# Patient Record
Sex: Male | Born: 1960
Health system: Southern US, Community
[De-identification: ages and names within clinical notes are randomized; demographics above are authoritative.]

## PROBLEM LIST (undated history)

## (undated) DIAGNOSIS — R52 Pain, unspecified: Secondary | ICD-10-CM

## (undated) DIAGNOSIS — F1020 Alcohol dependence, uncomplicated: Secondary | ICD-10-CM

## (undated) DIAGNOSIS — M1990 Unspecified osteoarthritis, unspecified site: Secondary | ICD-10-CM

## (undated) DIAGNOSIS — C801 Malignant (primary) neoplasm, unspecified: Secondary | ICD-10-CM

## (undated) DIAGNOSIS — F419 Anxiety disorder, unspecified: Secondary | ICD-10-CM

## (undated) DIAGNOSIS — IMO0002 Reserved for concepts with insufficient information to code with codable children: Secondary | ICD-10-CM

## (undated) DIAGNOSIS — F32A Depression, unspecified: Secondary | ICD-10-CM

## (undated) HISTORY — PX: DISTAL BICEPS TENDON REPAIR: SHX1461

---

## 1999-11-05 ENCOUNTER — Encounter: Payer: Self-pay | Admitting: Emergency Medicine

## 1999-11-05 ENCOUNTER — Emergency Department (HOSPITAL_COMMUNITY): Admission: EM | Admit: 1999-11-05 | Discharge: 1999-11-05 | Payer: Self-pay | Admitting: Emergency Medicine

## 2002-08-11 ENCOUNTER — Ambulatory Visit (HOSPITAL_COMMUNITY): Admission: RE | Admit: 2002-08-11 | Discharge: 2002-08-11 | Payer: Self-pay | Admitting: Internal Medicine

## 2010-09-04 ENCOUNTER — Ambulatory Visit (HOSPITAL_COMMUNITY): Admission: RE | Admit: 2010-09-04 | Discharge: 2010-09-04 | Payer: Self-pay | Admitting: Orthopedic Surgery

## 2011-12-29 ENCOUNTER — Encounter: Payer: Self-pay | Admitting: Gastroenterology

## 2012-01-11 ENCOUNTER — Ambulatory Visit (AMBULATORY_SURGERY_CENTER): Payer: 59 | Admitting: *Deleted

## 2012-01-11 VITALS — Ht 71.0 in | Wt 262.0 lb

## 2012-01-11 DIAGNOSIS — Z1211 Encounter for screening for malignant neoplasm of colon: Secondary | ICD-10-CM

## 2012-01-11 MED ORDER — PEG-KCL-NACL-NASULF-NA ASC-C 100 G PO SOLR: ORAL | Status: DC

## 2012-01-25 ENCOUNTER — Ambulatory Visit (AMBULATORY_SURGERY_CENTER): Payer: 59 | Admitting: Gastroenterology

## 2012-01-25 ENCOUNTER — Encounter: Payer: Self-pay | Admitting: Gastroenterology

## 2012-01-25 VITALS — BP 145/73 | HR 66 | Temp 96.5°F | Resp 24 | Ht 71.0 in | Wt 262.0 lb

## 2012-01-25 DIAGNOSIS — K5730 Diverticulosis of large intestine without perforation or abscess without bleeding: Secondary | ICD-10-CM

## 2012-01-25 DIAGNOSIS — Z1211 Encounter for screening for malignant neoplasm of colon: Secondary | ICD-10-CM

## 2012-01-25 MED ORDER — SODIUM CHLORIDE 0.9 % IV SOLN: 500.0000 mL | INTRAVENOUS | Status: DC

## 2012-01-25 NOTE — Patient Instructions (Signed)

## 2012-01-25 NOTE — Op Note (Signed)
[facility] [address] [place], [place]  [address]  COLONOSCOPY PROCEDURE REPORT  PATIENT:  Austin, Hopkins  MR#:  [id number] BIRTHDATE:  28-Nov-1960, 50 yrs. old  GENDER:  male ENDOSCOPIST:  Barbette Hair. Arlyce Dice, MD REF. BY:  Creola Corn, M.D. PROCEDURE DATE:  01/25/2012 PROCEDURE:  Diagnostic Colonoscopy ASA CLASS:  Class I INDICATIONS:  Routine Risk Screening MEDICATIONS:   MAC sedation, administered by CRNA propofol 340mg IV  DESCRIPTION OF PROCEDURE:   After the risks benefits and alternatives of the procedure were thoroughly explained, informed consent was obtained.  Digital rectal exam was performed and revealed no abnormalities.   The LB 180AL [id number] endoscope was introduced through the anus and advanced to the cecum, which was identified by both the appendix and ileocecal valve, without limitations.  The quality of the prep was excellent, using MoviPrep.  The instrument was then slowly withdrawn as the colon was fully examined. <<PROCEDUREIMAGES>>  FINDINGS:  Scattered diverticula were found in the sigmoid colon (see image4).  This was otherwise a normal examination of the colon (see image1 and image5).   Retroflexed views in the rectum revealed no abnormalities.    The time to cecum =  1) 1.75 minutes. The scope was then withdrawn in  1) 9.25  minutes from the cecum and the procedure completed. COMPLICATIONS:  None ENDOSCOPIC IMPRESSION: 1) Diverticula, scattered in the sigmoid colon 2) Otherwise normal examination RECOMMENDATIONS: 1) Continue current colorectal screening recommendations for "routine risk" patients with a repeat colonoscopy in 10 years. REPEAT EXAM:  In 10 year(s) for Colonoscopy.  ______________________________ Barbette Hair. Arlyce Dice, MD  CC:  n. eSIGNED:   Barbette Hair. Seleta Hovland at 01/25/2012 09:29 AM  Lauree Chandler, [phone]

## 2012-01-25 NOTE — Progress Notes (Signed)
Patient did not experience any of the following events: a burn prior to discharge; a fall within the facility; wrong site/side/patient/procedure/implant event; or a hospital transfer or hospital admission upon discharge from the facility. (G8907) Patient did not have preoperative order for IV antibiotic SSI prophylaxis. (G8918)  

## 2012-01-26 ENCOUNTER — Telehealth: Payer: Self-pay | Admitting: *Deleted

## 2012-01-26 NOTE — Telephone Encounter (Signed)
No answer. Message left. 

## 2012-07-17 ENCOUNTER — Encounter (HOSPITAL_COMMUNITY): Payer: Self-pay | Admitting: Emergency Medicine

## 2012-07-17 ENCOUNTER — Emergency Department (HOSPITAL_COMMUNITY)
Admission: EM | Admit: 2012-07-17 | Discharge: 2012-07-18 | Disposition: A | Discharge status: Home / Self Care | Payer: 59 | Attending: Emergency Medicine | Admitting: Emergency Medicine

## 2012-07-17 DIAGNOSIS — F17200 Nicotine dependence, unspecified, uncomplicated: Secondary | ICD-10-CM | POA: Insufficient documentation

## 2012-07-17 DIAGNOSIS — Z79899 Other long term (current) drug therapy: Secondary | ICD-10-CM | POA: Insufficient documentation

## 2012-07-17 DIAGNOSIS — F411 Generalized anxiety disorder: Secondary | ICD-10-CM | POA: Insufficient documentation

## 2012-07-17 DIAGNOSIS — F419 Anxiety disorder, unspecified: Secondary | ICD-10-CM

## 2012-07-17 LAB — CBC WITH DIFFERENTIAL/PLATELET
Hemoglobin: 15.4 g/dL (ref 13.0–17.0)
Lymphocytes Relative: 19 % (ref 12–46)
Lymphs Abs: 1.3 10*3/uL (ref 0.7–4.0)
Monocytes Relative: 6 % (ref 3–12)
Neutro Abs: 4.9 10*3/uL (ref 1.7–7.7)
Neutrophils Relative %: 74 % (ref 43–77)
Platelets: 188 10*3/uL (ref 150–400)
RBC: 4.47 MIL/uL (ref 4.22–5.81)
WBC: 6.7 10*3/uL (ref 4.0–10.5)

## 2012-07-17 LAB — RAPID URINE DRUG SCREEN, HOSP PERFORMED
Amphetamines: NOT DETECTED
Barbiturates: NOT DETECTED
Benzodiazepines: NOT DETECTED
Cocaine: NOT DETECTED
Tetrahydrocannabinol: POSITIVE — AB

## 2012-07-17 MED ORDER — ALUM & MAG HYDROXIDE-SIMETH 200-200-20 MG/5ML PO SUSP: 30.0000 mL | ORAL | Status: DC | PRN

## 2012-07-17 MED ORDER — NICOTINE 21 MG/24HR TD PT24: 21.0000 mg | MEDICATED_PATCH | Freq: Every day | TRANSDERMAL | Status: DC

## 2012-07-17 MED ORDER — ZOLPIDEM TARTRATE 5 MG PO TABS: 5.0000 mg | ORAL_TABLET | Freq: Every evening | ORAL | Status: DC | PRN

## 2012-07-17 MED ORDER — ACETAMINOPHEN 325 MG PO TABS
650.0000 mg | ORAL_TABLET | ORAL | Status: DC | PRN
  Administered 2012-07-17: 650 mg via ORAL
  Filled 2012-07-17: qty 2

## 2012-07-17 MED ORDER — IBUPROFEN 200 MG PO TABS: 600.0000 mg | ORAL_TABLET | Freq: Three times a day (TID) | ORAL | Status: DC | PRN

## 2012-07-17 MED ORDER — ONDANSETRON HCL 8 MG PO TABS: 4.0000 mg | ORAL_TABLET | Freq: Three times a day (TID) | ORAL | Status: DC | PRN

## 2012-07-17 NOTE — ED Provider Notes (Signed)
History     CSN: [id number]  Arrival date & time 07/17/12  2122   First MD Initiated Contact with Patient 07/17/12 2149      Chief Complaint  Patient presents with  . Suicidal    (Consider location/radiation/quality/duration/timing/severity/associated sxs/prior treatment) HPI  Patient presents to the emergency department [facility] [employer] with IVC papers. The patient's fianc took the papers out on him accusing him of being suicidal. Patient is a Publishing rights manager and works for [employer] and states that he and his fianc got into a fight yesterday a very basic verbal altercation after she got caught for stealing at the mall within the past couple of days. He states that since then she threatens to take out papers on him but then told him that she changed her mind. She's not yet come home since the incident because he says that she is ashamed of herself. The patient denies being suicidal or homicidal. He denies threatening her that he was suicidal during the argument. The patient is in no acute distress but he is very upset about being forced to be in the ER this time. Patient has no psych history aside from anxiety.  History reviewed. No pertinent past medical history.  Past Surgical History  Procedure Date  . Distal biceps tendon repair 4  years ago    right-Dr.Groning    Family History  Problem Relation Age of Onset  . Colon cancer Neg Hx     History  Substance Use Topics  . Smoking status: Current Everyday Smoker    Types: Cigars, Cigarettes    Last Attempt to Quit: 11/10/1991  . Smokeless tobacco: Never Used   Comment: rare cigar  . Alcohol Use: 8.4 oz/week    14 Shots of liquor per week     2 drinks of Whiskey per day per patient.      Review of Systems  Review of Systems  Gen: no weight loss, fevers, chills, night sweats  Eyes: no discharge or drainage, no occular pain or visual changes  Nose: no epistaxis or rhinorrhea  Mouth: no dental pain, no sore throat    Neck: no neck pain  Lungs:No wheezing, coughing or hemoptysis CV: no chest pain, palpitations, dependent edema or orthopnea  Abd: no abdominal pain, nausea, vomiting  GU: no dysuria or gross hematuria  MSK:  No abnormalities  Neuro: no headache, no focal neurologic deficits  Skin: no abnormalities Psyche: negative.   Allergies  Review of patient's allergies indicates no known allergies.  Home Medications   Current Outpatient Rx  Name Route Sig Dispense Refill  . ALPRAZOLAM 0.5 MG PO TABS Oral Take 1 tablet by mouth as needed. For anxiety    . ASPIRIN 325 MG PO TABS Oral Take 325 mg by mouth daily.    Marland Kitchen ESZOPICLONE 2 MG PO TABS Oral Take 2 mg by mouth at bedtime as needed. Take immediately before bedtime    . HYDROCODONE-IBUPROFEN 7.5-200 MG PO TABS Oral Take 0.5 tablets by mouth daily.    . ADULT MULTIVITAMIN W/MINERALS CH Oral Take 1 tablet by mouth daily.      BP 148/90  Pulse 95  Temp 98.8 F (37.1 C) (Oral)  Resp 18  SpO2 95%  Physical Exam  Nursing note and vitals reviewed. Constitutional: He appears well-developed and well-nourished. No distress.  HENT:  Head: Normocephalic and atraumatic.  Eyes: Pupils are equal, round, and reactive to light.  Neck: Normal range of motion. Neck supple.  Cardiovascular: Normal rate  and regular rhythm.   Pulmonary/Chest: Effort normal.  Abdominal: Soft.  Neurological: He is alert.  Skin: Skin is warm and dry.  Psychiatric: His mood appears anxious. His affect is angry. He does not exhibit a depressed mood. He expresses no homicidal and no suicidal ideation. He expresses no suicidal plans and no homicidal plans.    ED Course  Procedures (including critical care time)  Labs Reviewed  CBC WITH DIFFERENTIAL - Abnormal; Notable for the following:    MCH 34.5 (*)     All other components within normal limits  BASIC METABOLIC PANEL - Abnormal; Notable for the following:    Glucose, Bld 121 (*)     GFR calc non Af Amer 84 (*)      All other components within normal limits  URINE RAPID DRUG SCREEN (HOSP PERFORMED) - Abnormal; Notable for the following:    Opiates POSITIVE (*)     Tetrahydrocannabinol POSITIVE (*)     All other components within normal limits  ETHANOL - Abnormal; Notable for the following:    Alcohol, Ethyl (B) 169 (*)     All other components within normal limits   No results found.   1. Anxiety       MDM  Patient denies being suicidal. I have consulted with the act team who will decide whether the patient needs a pillow psych or not. I do not feel that the patient is suicidal and is my recommendation that he has IVC papers overturned however I have ordered tells psych.  Opiates, alcohol and weed found in patients system Telepsych ordered. Pt medically cleared.     Dorthula Matas, PA 07/18/12 [phone]

## 2012-07-17 NOTE — ED Notes (Signed)
Pt placed in blue scrubs, and wanded by security

## 2012-07-17 NOTE — BH Assessment (Signed)
Assessment Note   Austin Hopkins is an 51 y.o. male. Pt was placed under IVC petition taken out by his fiancee today.  ACT was unable to reach fiancee, [employer], by phone.  Pt reports that he and his fiancee are to be married this coming Friday.  This past Friday, fiancee was arrested for shoplifting, which led to a significant argument between them.  Pt reports he has been trying to talk with fiancee since this happened but they have only spoken briefly and she is mostly not returning his calls.  Today she stopped by briefly, pt was home all day today, and the sheriff picked him up this evening informing him of the IVC.  Pt denies SI/HI/AV.  States he has no psych history or substance abuse treatment history of any kind.  PT does admit to daily alcohol use for past 10+ years, 2-5 drinks per day.  Pt denies any problems associated with his alcohol use.  ACT was able to speak to pt's daughter, Juvencio Verdi, who was at [facility] with pt.  She reports that her father has a problem with drinking and getting very angry, which has gone on for some time.  She reports that a friend of her father's stopped by to check on him today and found him with his gun out.  The friend was upset by the encounter and left.  Pt reports that she does not think her father would harm himself however she is a little unnerved by the incident with the gun today and with his alcohol use.   Axis I: deferred Axis II: Deferred Axis III: History reviewed. No pertinent past medical history. Axis IV: problems with primary support group Axis V: 61-70 mild symptoms  Past Medical History: History reviewed. No pertinent past medical history.  Past Surgical History  Procedure Date  . Distal biceps tendon repair 4  years ago    right-Dr.Groning    Family History:  Family History  Problem Relation Age of Onset  . Colon cancer Neg Hx     Social History:  reports that he has been smoking Cigars and Cigarettes.  He has never used  smokeless tobacco. He reports that he drinks about 8.4 ounces of alcohol per week. He reports that he does not use illicit drugs.  Additional Social History:  Alcohol / Drug Use Pain Medications: Pt denies Prescriptions: Pt denies Over the Counter: Pt denies History of alcohol / drug use?: Yes Longest period of sobriety (when/how long): none Substance #1 Name of Substance 1: alcohol 1 - Age of First Use: 17 1 - Amount (size/oz): 2-5 drinks 1 - Frequency: daily 1 - Duration: 10-15 years 1 - Last Use / Amount: 07/18/12, 4 drinks  CIWA: CIWA-Ar BP: 148/90 mmHg Pulse Rate: 95  Nausea and Vomiting: no nausea and no vomiting Tactile Disturbances: none Tremor: no tremor Auditory Disturbances: not present Paroxysmal Sweats: no sweat visible Visual Disturbances: not present Anxiety: no anxiety, at ease Headache, Fullness in Head: none present Agitation: normal activity Orientation and Clouding of Sensorium: oriented and can do serial additions CIWA-Ar Total: 0  COWS:    Allergies: No Known Allergies  Home Medications:  (Not in a hospital admission)  OB/GYN Status:  No LMP for male patient.  General Assessment Data Location of Assessment: [facility] ED ACT Assessment: Yes Living Arrangements: Spouse/significant other Can pt return to current living arrangement?: Yes Admission Status: Involuntary     Risk to self Suicidal Ideation: No Suicidal Intent: No Is  patient at risk for suicide?: No Suicidal Plan?: No Access to Means: No What has been your use of drugs/alcohol within the last 12 months?: daily use of alcohol Previous Attempts/Gestures: No Intentional Self Injurious Behavior: None Family Suicide History: No Recent stressful life event(s):  (pt denies ) Persecutory voices/beliefs?: No Depression: No Substance abuse history and/or treatment for substance abuse?: No Suicide prevention information given to non-admitted patients: Yes  Risk to Others Homicidal Ideation:  No Thoughts of Harm to Others: No Current Homicidal Intent: No Current Homicidal Plan: No Access to Homicidal Means: No History of harm to others?: No Assessment of Violence: None Noted Does patient have access to weapons?: Yes (Comment) (3 guns in home) Criminal Charges Pending?: No Does patient have a court date: No  Psychosis Hallucinations: None noted Delusions: None noted  Mental Status Report Appear/Hygiene: Other (Comment) (casual) Eye Contact: Good Motor Activity: Unremarkable Speech: Logical/coherent Level of Consciousness: Alert Mood: Other (Comment) (cooperative) Affect: Appropriate to circumstance Anxiety Level: Minimal Thought Processes: Coherent;Relevant Judgement: Unimpaired Orientation: Person;Place;Time;Situation Obsessive Compulsive Thoughts/Behaviors: None  Cognitive Functioning Concentration: Normal Memory: Recent Intact;Remote Intact IQ: Above Average Insight: Good Impulse Control: Fair Appetite: Good Weight Loss: 20  (intentional) Weight Gain: 0  Sleep: No Change Vegetative Symptoms: None  ADLScreening [facility] Assessment Services) Patient's cognitive ability adequate to safely complete daily activities?: Yes Patient able to express need for assistance with ADLs?: Yes Independently performs ADLs?: Yes (appropriate for developmental age)  Abuse/Neglect [facility]) Physical Abuse: Denies Verbal Abuse: Denies Sexual Abuse: Denies  Prior Inpatient Therapy Prior Inpatient Therapy: No  Prior Outpatient Therapy Prior Outpatient Therapy: No  ADL Screening (condition at time of admission) Patient's cognitive ability adequate to safely complete daily activities?: Yes Patient able to express need for assistance with ADLs?: Yes Independently performs ADLs?: Yes (appropriate for developmental age) Weakness of Legs: None Weakness of Arms/Hands: None  Home Assistive Devices/Equipment Home Assistive Devices/Equipment: None    Abuse/Neglect Assessment  (Assessment to be complete while patient is alone) Physical Abuse: Denies Verbal Abuse: Denies Sexual Abuse: Denies Exploitation of patient/patient's resources: Denies Self-Neglect: Denies     Merchant navy officer (For Healthcare) Advance Directive: Patient does not have advance directive;Patient would not like information    Additional Information 1:1 In Past 12 Months?: No CIRT Risk: No Elopement Risk: No Does patient have medical clearance?: Yes     Disposition: I discussed this pt with PA Neva Seat and with Dr Radford Pax of [facility].  Decision made to have telepsych evaluate the pt before making determination to release him from the IVC petition.    On Site Evaluation by:   Reviewed with Physician:     Lorri Frederick 07/17/2012 10:41 PM

## 2012-07-17 NOTE — ED Notes (Addendum)
Fiance took out IVC paperwork on pt.  Pt here with [facility] Dept and denies any symptoms and denies suicidal ideation. Pt states he did have argument with fiance tonight.

## 2012-07-18 LAB — BASIC METABOLIC PANEL
BUN: 9 mg/dL (ref 6–23)
CO2: 25 mEq/L (ref 19–32)
Chloride: 102 mEq/L (ref 96–112)
GFR calc non Af Amer: 84 mL/min — ABNORMAL LOW (ref >90)
Glucose, Bld: 121 mg/dL — ABNORMAL HIGH (ref 70–99)
Potassium: 3.7 mEq/L (ref 3.5–5.1)
Sodium: 141 mEq/L (ref 135–145)

## 2012-07-18 NOTE — ED Provider Notes (Addendum)
Medical screening examination/treatment/procedure(s) were performed by non-physician practitioner and as supervising physician I was immediately available for consultation/collaboration.   Sunnie Nielsen, MD 07/18/12 0124  1:25 AM DR Jacky Kindle, PSY recs d/c home, is NOT suicidal.  Sunnie Nielsen, MD 07/18/12 [phone]

## 2012-07-18 NOTE — ED Notes (Addendum)
telepsych interview occurring now

## 2012-07-18 NOTE — ED Notes (Signed)
Telepsych complete. IVC officially rescinded. Placed on chart. Orders to d/c and d/c instructions received per Dr. Dierdre Highman. Belongings returned to pt, signed for by pt (witnessed by BB, CN) adult daughter with pt (ride home).

## 2012-07-18 NOTE — ED Notes (Signed)
Confirmed 3rd on list for telepsych.

## 2012-07-18 NOTE — ED Notes (Addendum)
Pt amiable, alert, NAD, calm, interactive, intermitantly tearful, mentions mild HA. Talking thru events of last 24 hrs. Sitter at [facility], belongings bagged labeled and in locker 1. Given soda per request. Denies SI/ HI.

## 2014-07-27 ENCOUNTER — Other Ambulatory Visit: Payer: Self-pay | Admitting: Urology

## 2014-08-01 ENCOUNTER — Other Ambulatory Visit: Payer: Self-pay | Admitting: Urology

## 2014-08-01 MED ORDER — INDOCYANINE GREEN 25 MG IV SOLR: 2.5000 mg | Freq: Once | INTRAVENOUS | Status: AC

## 2014-09-03 ENCOUNTER — Other Ambulatory Visit: Payer: Self-pay | Admitting: Urology

## 2014-10-10 ENCOUNTER — Encounter (HOSPITAL_COMMUNITY): Admission: RE | Payer: Self-pay | Source: Ambulatory Visit

## 2014-10-10 ENCOUNTER — Inpatient Hospital Stay (HOSPITAL_COMMUNITY): Admission: RE | Admit: 2014-10-10 | Payer: 59 | Source: Ambulatory Visit | Admitting: Urology

## 2014-10-10 SURGERY — ROBOTIC ASSISTED LAPAROSCOPIC RADICAL PROSTATECTOMY
Anesthesia: General

## 2014-10-12 ENCOUNTER — Inpatient Hospital Stay (HOSPITAL_COMMUNITY): Admission: RE | Admit: 2014-10-12 | Payer: 59 | Source: Ambulatory Visit

## 2014-10-12 ENCOUNTER — Encounter (HOSPITAL_COMMUNITY)
Admission: RE | Admit: 2014-10-12 | Discharge: 2014-10-12 | Disposition: A | Discharge status: Home / Self Care | Payer: 59 | Source: Ambulatory Visit | Attending: Urology | Admitting: Urology

## 2014-10-12 ENCOUNTER — Encounter (HOSPITAL_COMMUNITY): Payer: Self-pay

## 2014-10-12 ENCOUNTER — Other Ambulatory Visit: Payer: Self-pay

## 2014-10-12 ENCOUNTER — Ambulatory Visit (HOSPITAL_COMMUNITY)
Admission: RE | Admit: 2014-10-12 | Discharge: 2014-10-12 | Disposition: A | Discharge status: Home / Self Care | Payer: 59 | Source: Ambulatory Visit | Attending: Urology | Admitting: Urology

## 2014-10-12 DIAGNOSIS — R001 Bradycardia, unspecified: Secondary | ICD-10-CM | POA: Insufficient documentation

## 2014-10-12 DIAGNOSIS — F17210 Nicotine dependence, cigarettes, uncomplicated: Secondary | ICD-10-CM | POA: Diagnosis not present

## 2014-10-12 DIAGNOSIS — C61 Malignant neoplasm of prostate: Secondary | ICD-10-CM

## 2014-10-12 DIAGNOSIS — Z01818 Encounter for other preprocedural examination: Secondary | ICD-10-CM | POA: Diagnosis present

## 2014-10-12 HISTORY — DX: Anxiety disorder, unspecified: F41.9

## 2014-10-12 HISTORY — DX: Malignant (primary) neoplasm, unspecified: C80.1

## 2014-10-12 HISTORY — DX: Pain, unspecified: R52

## 2014-10-12 HISTORY — DX: Reserved for concepts with insufficient information to code with codable children: IMO0002

## 2014-10-12 LAB — BASIC METABOLIC PANEL
Anion gap: 12 (ref 5–15)
BUN: 13 mg/dL (ref 6–23)
CALCIUM: 9.2 mg/dL (ref 8.4–10.5)
CO2: 25 mEq/L (ref 19–32)
Chloride: 100 mEq/L (ref 96–112)
Creatinine, Ser: 0.93 mg/dL (ref 0.50–1.35)
GFR calc Af Amer: >90 mL/min (ref >90)
Glucose, Bld: 101 mg/dL — ABNORMAL HIGH (ref 70–99)
Potassium: 4.4 mEq/L (ref 3.7–5.3)
Sodium: 137 mEq/L (ref 137–147)

## 2014-10-12 LAB — CBC
HCT: 42.5 % (ref 39.0–52.0)
Hemoglobin: 14.4 g/dL (ref 13.0–17.0)
MCH: 34.5 pg — ABNORMAL HIGH (ref 26.0–34.0)
MCHC: 33.9 g/dL (ref 30.0–36.0)
MCV: 101.9 fL — ABNORMAL HIGH (ref 78.0–100.0)
Platelets: 180 10*3/uL (ref 150–400)
RBC: 4.17 MIL/uL — ABNORMAL LOW (ref 4.22–5.81)
RDW: 13.1 % (ref 11.5–15.5)
WBC: 5.1 10*3/uL (ref 4.0–10.5)

## 2014-10-12 LAB — ABO/RH: ABO/RH(D): O POS

## 2014-10-12 NOTE — Patient Instructions (Signed)
YOUR SURGERY IS SCHEDULED AT [facility]  [id number]  12/7  REPORT TO  SHORT STAY CENTER AT:  9:00 AM   DO NOT EAT OR DRINK ANYTHING AFTER MIDNIGHT THE NIGHT BEFORE YOUR SURGERY.  YOU MAY BRUSH YOUR TEETH, RINSE OUT YOUR MOUTH--BUT NO WATER, NO FOOD, NO CHEWING GUM, NO MINTS, NO CANDIES, NO CHEWING TOBACCO.  PLEASE TAKE THE FOLLOWING MEDICATIONS THE AM OF YOUR SURGERY WITH A FEW SIPS OF WATER:   HYDROCODONE / IBUPROFEN IF NEEDED    DO NOT BRING VALUABLES, MONEY, CREDIT CARDS.  DO NOT WEAR JEWELRY, MAKE-UP, NAIL POLISH AND NO METAL PINS OR CLIPS IN YOUR HAIR. CONTACT LENS, DENTURES / PARTIALS, GLASSES SHOULD NOT BE WORN TO SURGERY AND IN MOST CASES-HEARING AIDS WILL NEED TO BE REMOVED.  BRING YOUR GLASSES CASE, ANY EQUIPMENT NEEDED FOR YOUR CONTACT LENS. FOR PATIENTS ADMITTED TO THE HOSPITAL--CHECK OUT TIME THE DAY OF DISCHARGE IS 11:00 AM.  ALL INPATIENT ROOMS ARE PRIVATE - WITH BATHROOM, TELEPHONE, TELEVISION AND WIFI INTERNET.    PLEASE BE AWARE THAT YOU MAY NEED ADDITIONAL BLOOD DRAWN DAY OF YOUR SURGERY  _______________________________________________________________________   [facility] - Preparing for Surgery Before surgery, you can play an important role.  Because skin is not sterile, your skin needs to be as free of germs as possible.  You can reduce the number of germs on your skin by washing with CHG (chlorahexidine gluconate) soap before surgery.  CHG is an antiseptic cleaner which kills germs and bonds with the skin to continue killing germs even after washing. Please DO NOT use if you have an allergy to CHG or antibacterial soaps.  If your skin becomes reddened/irritated stop using the CHG and inform your nurse when you arrive at Short Stay. Do not shave (including legs and underarms) for at least 48 hours prior to the first CHG shower.  You may shave your face/neck. Please follow these instructions carefully:  1.  Shower with CHG Soap the night before  surgery and the  morning of Surgery.  2.  If you choose to wash your hair, wash your hair first as usual with your  normal  shampoo.  3.  After you shampoo, rinse your hair and body thoroughly to remove the  shampoo.                           4.  Use CHG as you would any other liquid soap.  You can apply chg directly  to the skin and wash                       Gently with a scrungie or clean washcloth.  5.  Apply the CHG Soap to your body ONLY FROM THE NECK DOWN.   Do not use on face/ open                           Wound or open sores. Avoid contact with eyes, ears mouth and genitals (private parts).                       Wash face,  Genitals (private parts) with your normal soap.             6.  Wash thoroughly, paying special attention to the area where your surgery  will be performed.  7.  Thoroughly rinse your body with  warm water from the neck down.  8.  DO NOT shower/wash with your normal soap after using and rinsing off  the CHG Soap.                9.  Pat yourself dry with a clean towel.            10.  Wear clean pajamas.            11.  Place clean sheets on your bed the night of your first shower and do not  sleep with pets. Day of Surgery : Do not apply any lotions/deodorants the morning of surgery.  Please wear clean clothes to the hospital/surgery center.  FAILURE TO FOLLOW THESE INSTRUCTIONS MAY RESULT IN THE CANCELLATION OF YOUR SURGERY PATIENT SIGNATURE_________________________________  NURSE SIGNATURE__________________________________  ________________________________________________________________________   Austin Hopkins  An incentive spirometer is a tool that can help keep your lungs clear and active. This tool measures how well you are filling your lungs with each breath. Taking long deep breaths may help reverse or decrease the chance of developing breathing (pulmonary) problems (especially infection) following:  A long period of time when you are unable to  move or be active. BEFORE THE PROCEDURE   If the spirometer includes an indicator to show your best effort, your nurse or respiratory therapist will set it to a desired goal.  If possible, sit up straight or lean slightly forward. Try not to slouch.  Hold the incentive spirometer in an upright position. INSTRUCTIONS FOR USE   Sit on the edge of your bed if possible, or sit up as far as you can in bed or on a chair.  Hold the incentive spirometer in an upright position.  Breathe out normally.  Place the mouthpiece in your mouth and seal your lips tightly around it.  Breathe in slowly and as deeply as possible, raising the piston or the ball toward the top of the column.  Hold your breath for 3-5 seconds or for as long as possible. Allow the piston or ball to fall to the bottom of the column.  Remove the mouthpiece from your mouth and breathe out normally.  Rest for a few seconds and repeat Steps 1 through 7 at least 10 times every 1-2 hours when you are awake. Take your time and take a few normal breaths between deep breaths.  The spirometer may include an indicator to show your best effort. Use the indicator as a goal to work toward during each repetition.  After each set of 10 deep breaths, practice coughing to be sure your lungs are clear. If you have an incision (the cut made at the time of surgery), support your incision when coughing by placing a pillow or rolled up towels firmly against it. Once you are able to get out of bed, walk around indoors and cough well. You may stop using the incentive spirometer when instructed by your caregiver.  RISKS AND COMPLICATIONS  Take your time so you do not get dizzy or light-headed.  If you are in pain, you may need to take or ask for pain medication before doing incentive spirometry. It is harder to take a deep breath if you are having pain. AFTER USE  Rest and breathe slowly and easily.  It can be helpful to keep track of a log of  your progress. Your caregiver can provide you with a simple table to help with this. If you are using the spirometer at home, follow these  instructions: SEEK MEDICAL CARE IF:   You are having difficultly using the spirometer.  You have trouble using the spirometer as often as instructed.  Your pain medication is not giving enough relief while using the spirometer.  You develop fever of 100.5 F (38.1 C) or higher. SEEK IMMEDIATE MEDICAL CARE IF:   You cough up bloody sputum that had not been present before.  You develop fever of 102 F (38.9 C) or greater.  You develop worsening pain at or near the incision site. MAKE SURE YOU:   Understand these instructions.  Will watch your condition.  Will get help right away if you are not doing well or get worse. Document Released: 03/08/2007 Document Revised: 01/18/2012 Document Reviewed: 05/09/2007 ExitCare Patient Information 2014 Exit[employer], [place].   ________________________________________________________________________  WHAT IS A BLOOD TRANSFUSION? Blood Transfusion Information  A transfusion is the replacement of blood or some of its parts. Blood is made up of multiple cells which provide different functions.  Red blood cells carry oxygen and are used for blood loss replacement.  White blood cells fight against infection.  Platelets control bleeding.  Plasma helps clot blood.  Other blood products are available for specialized needs, such as hemophilia or other clotting disorders. BEFORE THE TRANSFUSION  Who gives blood for transfusions?   Healthy volunteers who are fully evaluated to make sure their blood is safe. This is blood bank blood. Transfusion therapy is the safest it has ever been in the practice of medicine. Before blood is taken from a donor, a complete history is taken to make sure that person has no history of diseases nor engages in risky social behavior (examples are intravenous drug use or sexual activity  with multiple partners). The donor's travel history is screened to minimize risk of transmitting infections, such as malaria. The donated blood is tested for signs of infectious diseases, such as HIV and hepatitis. The blood is then tested to be sure it is compatible with you in order to minimize the chance of a transfusion reaction. If you or a relative donates blood, this is often done in anticipation of surgery and is not appropriate for emergency situations. It takes many days to process the donated blood. RISKS AND COMPLICATIONS Although transfusion therapy is very safe and saves many lives, the main dangers of transfusion include:   Getting an infectious disease.  Developing a transfusion reaction. This is an allergic reaction to something in the blood you were given. Every precaution is taken to prevent this. The decision to have a blood transfusion has been considered carefully by your caregiver before blood is given. Blood is not given unless the benefits outweigh the risks. AFTER THE TRANSFUSION  Right after receiving a blood transfusion, you will usually feel much better and more energetic. This is especially true if your red blood cells have gotten low (anemic). The transfusion raises the level of the red blood cells which carry oxygen, and this usually causes an energy increase.  The nurse administering the transfusion will monitor you carefully for complications. HOME CARE INSTRUCTIONS  No special instructions are needed after a transfusion. You may find your energy is better. Speak with your caregiver about any limitations on activity for underlying diseases you may have. SEEK MEDICAL CARE IF:   Your condition is not improving after your transfusion.  You develop redness or irritation at the intravenous (IV) site. SEEK IMMEDIATE MEDICAL CARE IF:  Any of the following symptoms occur over the next 12 hours:  Shaking  chills.  You have a temperature by mouth above 102 F (38.9  C), not controlled by medicine.  Chest, back, or muscle pain.  People around you feel you are not acting correctly or are confused.  Shortness of breath or difficulty breathing.  Dizziness and fainting.  You get a rash or develop hives.  You have a decrease in urine output.  Your urine turns a dark color or changes to pink, red, or brown. Any of the following symptoms occur over the next 10 days:  You have a temperature by mouth above 102 F (38.9 C), not controlled by medicine.  Shortness of breath.  Weakness after normal activity.  The white part of the eye turns yellow (jaundice).  You have a decrease in the amount of urine or are urinating less often.  Your urine turns a dark color or changes to pink, red, or brown. Document Released: 10/23/2000 Document Revised: 01/18/2012 Document Reviewed: 06/11/2008 [facility] Patient Information 2014 [place], [place].  _______________________________________________________________________

## 2014-10-12 NOTE — Pre-Procedure Instructions (Signed)
EKG AND CXR WERE DONE TODAY PREOP AT [facility] AS PER ORDERS DR. BORDEN PT'S CHART GIVEN TO FOLLOW UP NURSE WILHEMINA HENDRIX, RN FOR REVIEW OF ALL PREOP TEST RESULTS.

## 2014-10-13 NOTE — H&P (Signed)
Chief Complaint Prostate Cancer   Reason For Visit Reason for visit: 2nd opinion regarding treatment options for prostate cancer.  PCP: Dr. 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 He presented to Dr. Franchot Gallo with an elevated PSA. This remained elevated at 4.71 when rechecked prompting a prostate biopsy on 07/05/14. His biopsy demonstrated Gleason 3+4=7 adenocarcinoma of the prostate with 4 out of 12 biopsy cores positive for malignancy. He has no family history of prostate cancer. He is exceedingly healthy overall. He has been very well-informed through prior discussions with Dr. Diona Fanti and Dr. Tresa Moore. He presents today for second opinion and potentially proceed with surgical treatment.    TNM stage: cT1c Nx Mx  PSA: 4.71  Gleason score: 3+4=7  Biopsy (07/05/14): 4/12 cores positive   Left: L mid (10%, 3+3=6)   Right: R lateral apex (30%, 3+4=7), R mid (30%, 3+4=7), R lateral mid (30%, 3+3=6)  Prostate volume: 21.5 cc    Nomogram  OC disease: 46%  EPE: 53%  SVI: 3%  LNI: 2%  PFS (surgery): 90% at 5 years, 82% at 10 years    Urinary function: He has minimal lower urinary tract symptoms. IPSS is 3.  Erectile function: He has mild erectile dysfunction. SHIM score is 20. He has previously responded well to Levitra.   Past Medical History Problems  1. History of No significant past medical history  Surgical History Problems  1. History of Arm Incision  Current Meds 1. Vicoprofen TABS; 5/325 for shoulder/arm pain;  Therapy: (Recorded:26Jun2015) to Recorded  Allergies Medication  1. No Known Drug Allergies  Family History Problems  1. Family history of Death of family member : Mother, Father   mother deceased at age 16; multiple med problemsfather deceasd at age 40; [place]     cancer 2. Family history of lung cancer (Z80.1) : Father  Social History Problems     Alcohol use (F10.99)   2-4   Caffeine use (F15.90)   1-2   Former smoker [phone])   smoked for approx 10 yrs & quit in 2000   Married   Occupation   NP w/[employer]  Review of Systems Constitutional, skin, eye, otolaryngeal, hematologic/lymphatic, cardiovascular, pulmonary, endocrine, musculoskeletal, gastrointestinal, neurological and psychiatric system(s) were reviewed and pertinent findings if present are noted.    Vitals Vital Signs [Data Includes: Last 1 Day]  Recorded: [id number] 08:24AM  Blood Pressure: 183 / 91 Heart Rate: 65  Physical Exam Constitutional: Well nourished and well developed . No acute distress.  ENT:. The ears and nose are normal in appearance.  Neck: The appearance of the neck is normal and no neck mass is present.  Pulmonary: No respiratory distress, normal respiratory rhythm and effort and clear bilateral breath sounds.  Cardiovascular: Heart rate and rhythm are normal . No peripheral edema.  Abdomen: The abdomen is soft and nontender. No masses are palpated. No CVA tenderness. No hernias are palpable. No hepatosplenomegaly noted.  Rectal: Rectal exam demonstrates normal sphincter tone, no tenderness and no masses. Prostate size is estimated to be 35 g. The prostate has no nodularity and is not tender. The left seminal vesicle is nonpalpable. The right seminal vesicle is nonpalpable. The perineum is normal on inspection.  Lymphatics: The femoral and inguinal nodes are not enlarged or tender.  Skin: Normal skin turgor, no visible rash and no visible skin lesions.  Neuro/Psych:. Mood and affect are appropriate.  Results/Data Urine [Data Includes: Last 1 Day]   [id number]  COLOR YELLOW   APPEARANCE CLEAR   SPECIFIC GRAVITY 1.020   pH 6.0   GLUCOSE NEG mg/dL  BILIRUBIN NEG   KETONE NEG mg/dL  BLOOD NEG   PROTEIN NEG mg/dL  UROBILINOGEN 0.2 mg/dL  NITRITE NEG   LEUKOCYTE ESTERASE NEG    I have reviewed his medical records, PSA  results, and pathology report. Findings are as dictated above.   Assessment Assessed  1. Prostate cancer (C61) 2. Erectile dysfunction (N52.9)  Plan Erectile dysfunction  1. Follow-up Office  Follow-up - Please schedule patient with [employer] rep from [place] of Service  Requested for:  [id number] Health Maintenance  2. UA With REFLEX; [Do Not Release]; Status:Complete;   Done: [id number] 08:17AM Prostate cancer  3. Follow-up Keep Future Appt Office  Follow-up  Status: Complete  Done: [id number]  Discussion/Summary 1. Prostate cancer: Austin Hopkins has elected to go forward with surgical treatment of his prostate cancer.   The patient was counseled about the natural history of prostate cancer and the standard treatment options that are available for prostate cancer. It was explained to him how his age and life expectancy, clinical stage, Gleason score, and PSA affect his prognosis, the decision to proceed with additional staging studies, as well as how that information influences recommended treatment strategies. We discussed the roles for active surveillance, radiation therapy, surgical therapy, androgen deprivation, as well as ablative therapy options for the treatment of prostate cancer as appropriate to his individual cancer situation. We discussed the risks and benefits of these options with regard to their impact on cancer control and also in terms of potential adverse events, complications, and impact on quiality of life particularly related to urinary, bowel, and sexual function. The patient was encouraged to ask questions throughout the discussion today and all questions were answered to his stated satisfaction. In addition, the patient was provided with and/or directed to appropriate resources and literature for further education about prostate cancer and treatment options.   We discussed surgical therapy for prostate cancer including the different  available surgical approaches. We discussed, in detail, the risks and expectations of surgery with regard to cancer control, urinary control, and erectile function as well as the expected postoperative recovery process. Additional risks of surgery including but not limited to bleeding, infection, hernia formation, nerve damage, lymphocele formation, bowel/rectal injury potentially necessitating colostomy, damage to the urinary tract resulting in urine leakage, urethral stricture, and the cardiopulmonary risks such as myocardial infarction, stroke, death, venothromboembolism, etc. were explained. The risk of open surgical conversion for robotic/laparoscopic prostatectomy was also discussed.     He will be scheduled for a bilateral nerve sparing robotic-assisted laparoscopic radical prostatectomy and bilateral pelvic lymphadenectomy.    He did express interest in being aggressive about penile rehabilitation and has been provided information and will be scheduled for an appointment with the vacuum erection device representative preoperatively.    Cc: Dr. Shon Baton  Dr. Franchot Gallo  A total of 55 minutes were spent in the overall care of the patient today with 50 minutes in direct face to face consultation.    Signatures Electronically signed by : [name], M.D.; Sep 18 2014  2:03PM EST

## 2014-10-14 NOTE — Anesthesia Preprocedure Evaluation (Addendum)
Anesthesia Evaluation  Patient identified by MRN, date of birth, ID band Patient awake    Reviewed: Allergy & Precautions, H&P , NPO status , Patient's Chart, lab work & pertinent test results  History of Anesthesia Complications Negative for: history of anesthetic complications  Airway Mallampati: II  TM Distance: >3 FB Neck ROM: Full    Dental no notable dental hx. (+) Dental Advisory Given   Pulmonary Current Smoker,  breath sounds clear to auscultation  Pulmonary exam normal       Cardiovascular Exercise Tolerance: Good Rhythm:Regular Rate:Normal     Neuro/Psych PSYCHIATRIC DISORDERS Anxiety negative neurological ROS     GI/Hepatic negative GI ROS, Neg liver ROS,   Endo/Other  obesity  Renal/GU negative Renal ROS  negative genitourinary   Musculoskeletal negative musculoskeletal ROS (+)   Abdominal (+) + obese,   Peds negative pediatric ROS (+)  Hematology negative hematology ROS (+)   Anesthesia Other Findings Prostate ca  Reproductive/Obstetrics negative OB ROS                            Anesthesia Physical Anesthesia Plan  ASA: II  Anesthesia Plan: General   Post-op Pain Management:    Induction: Intravenous  Airway Management Planned: Oral ETT  Additional Equipment:   Intra-op Plan:   Post-operative Plan: Extubation in OR  Informed Consent: I have reviewed the patients History and Physical, chart, labs and discussed the procedure including the risks, benefits and alternatives for the proposed anesthesia with the patient or authorized representative who has indicated his/her understanding and acceptance.   Dental advisory given  Plan Discussed with: CRNA  Anesthesia Plan Comments:         Anesthesia Quick Evaluation

## 2014-10-15 ENCOUNTER — Encounter (HOSPITAL_COMMUNITY): Payer: Self-pay | Admitting: *Deleted

## 2014-10-15 ENCOUNTER — Inpatient Hospital Stay (HOSPITAL_COMMUNITY): Payer: 59 | Admitting: Anesthesiology

## 2014-10-15 ENCOUNTER — Encounter (HOSPITAL_COMMUNITY)
Admission: RE | Disposition: A | Discharge status: Home / Self Care | Payer: Self-pay | Source: Ambulatory Visit | Attending: Urology

## 2014-10-15 ENCOUNTER — Inpatient Hospital Stay (HOSPITAL_COMMUNITY)
Admission: RE | Admit: 2014-10-15 | Discharge: 2014-10-16 | DRG: 708 | Disposition: A | Discharge status: Home / Self Care | Payer: 59 | Source: Ambulatory Visit | Attending: Urology | Admitting: Urology

## 2014-10-15 DIAGNOSIS — Z87891 Personal history of nicotine dependence: Secondary | ICD-10-CM | POA: Diagnosis not present

## 2014-10-15 DIAGNOSIS — C61 Malignant neoplasm of prostate: Secondary | ICD-10-CM | POA: Diagnosis present

## 2014-10-15 DIAGNOSIS — N529 Male erectile dysfunction, unspecified: Secondary | ICD-10-CM | POA: Diagnosis present

## 2014-10-15 HISTORY — PX: LYMPHADENECTOMY: SHX5960

## 2014-10-15 HISTORY — PX: ROBOT ASSISTED LAPAROSCOPIC RADICAL PROSTATECTOMY: SHX5141

## 2014-10-15 LAB — HEMOGLOBIN AND HEMATOCRIT, BLOOD
HEMATOCRIT: 40.9 % (ref 39.0–52.0)
Hemoglobin: 13.6 g/dL (ref 13.0–17.0)

## 2014-10-15 LAB — TYPE AND SCREEN
ABO/RH(D): O POS
Antibody Screen: NEGATIVE

## 2014-10-15 SURGERY — ROBOTIC ASSISTED LAPAROSCOPIC RADICAL PROSTATECTOMY LEVEL 2
Anesthesia: General

## 2014-10-15 MED ORDER — LABETALOL HCL 5 MG/ML IV SOLN
INTRAVENOUS | Status: DC | PRN
  Administered 2014-10-15: 2.5 mg via INTRAVENOUS

## 2014-10-15 MED ORDER — INFLUENZA VAC SPLIT QUAD 0.5 ML IM SUSY
0.5000 mL | PREFILLED_SYRINGE | INTRAMUSCULAR | Status: AC
  Administered 2014-10-16: 0.5 mL via INTRAMUSCULAR
  Filled 2014-10-15 (×2): qty 0.5

## 2014-10-15 MED ORDER — HYDROMORPHONE HCL 1 MG/ML IJ SOLN
INTRAMUSCULAR | Status: AC
  Filled 2014-10-15: qty 1

## 2014-10-15 MED ORDER — MEPERIDINE HCL 50 MG/ML IJ SOLN
6.2500 mg | INTRAMUSCULAR | Status: DC | PRN
  Administered 2014-10-15: 12.5 mg via INTRAVENOUS

## 2014-10-15 MED ORDER — ONDANSETRON HCL 4 MG/2ML IJ SOLN
INTRAMUSCULAR | Status: AC
  Filled 2014-10-15: qty 2

## 2014-10-15 MED ORDER — GLYCOPYRROLATE 0.2 MG/ML IJ SOLN
INTRAMUSCULAR | Status: DC | PRN
  Administered 2014-10-15: .8 mg via INTRAVENOUS

## 2014-10-15 MED ORDER — HYDROMORPHONE HCL 2 MG/ML IJ SOLN
INTRAMUSCULAR | Status: AC
  Filled 2014-10-15: qty 1

## 2014-10-15 MED ORDER — LIDOCAINE HCL (CARDIAC) 20 MG/ML IV SOLN
INTRAVENOUS | Status: DC | PRN
  Administered 2014-10-15: 60 mg via INTRAVENOUS

## 2014-10-15 MED ORDER — SODIUM CHLORIDE 0.9 % IR SOLN
Status: DC | PRN
  Administered 2014-10-15: 1 via INTRAVESICAL

## 2014-10-15 MED ORDER — SUFENTANIL CITRATE 50 MCG/ML IV SOLN
INTRAVENOUS | Status: DC | PRN
  Administered 2014-10-15 (×2): 10 ug via INTRAVENOUS
  Administered 2014-10-15 (×2): 5 ug via INTRAVENOUS
  Administered 2014-10-15: 10 ug via INTRAVENOUS
  Administered 2014-10-15 (×2): 5 ug via INTRAVENOUS

## 2014-10-15 MED ORDER — ONDANSETRON HCL 4 MG/2ML IJ SOLN: 4.0000 mg | Freq: Once | INTRAMUSCULAR | Status: DC | PRN

## 2014-10-15 MED ORDER — ROCURONIUM BROMIDE 100 MG/10ML IV SOLN
INTRAVENOUS | Status: DC | PRN
  Administered 2014-10-15 (×2): 20 mg via INTRAVENOUS
  Administered 2014-10-15: 50 mg via INTRAVENOUS
  Administered 2014-10-15 (×2): 25 mg via INTRAVENOUS

## 2014-10-15 MED ORDER — METOCLOPRAMIDE HCL 5 MG/ML IJ SOLN
INTRAMUSCULAR | Status: AC
  Filled 2014-10-15: qty 2

## 2014-10-15 MED ORDER — METOCLOPRAMIDE HCL 5 MG/ML IJ SOLN
INTRAMUSCULAR | Status: DC | PRN
  Administered 2014-10-15: 10 mg via INTRAVENOUS

## 2014-10-15 MED ORDER — HYDROCODONE-ACETAMINOPHEN 5-325 MG PO TABS: 2.0000 | ORAL_TABLET | Freq: Four times a day (QID) | ORAL | Status: DC | PRN

## 2014-10-15 MED ORDER — DOCUSATE SODIUM 100 MG PO CAPS
100.0000 mg | ORAL_CAPSULE | Freq: Two times a day (BID) | ORAL | Status: DC
  Administered 2014-10-15 – 2014-10-16 (×2): 100 mg via ORAL
  Filled 2014-10-15 (×3): qty 1

## 2014-10-15 MED ORDER — HYDROCODONE-ACETAMINOPHEN 5-325 MG PO TABS
1.0000 | ORAL_TABLET | ORAL | Status: DC | PRN
  Administered 2014-10-15 – 2014-10-16 (×3): 1 via ORAL
  Filled 2014-10-15: qty 1
  Filled 2014-10-15: qty 2
  Filled 2014-10-15: qty 1

## 2014-10-15 MED ORDER — KCL IN DEXTROSE-NACL 20-5-0.45 MEQ/L-%-% IV SOLN
INTRAVENOUS | Status: DC
  Administered 2014-10-15: 1000 mL via INTRAVENOUS
  Administered 2014-10-15: 22:00:00 via INTRAVENOUS
  Filled 2014-10-15 (×4): qty 1000

## 2014-10-15 MED ORDER — DEXAMETHASONE SODIUM PHOSPHATE 10 MG/ML IJ SOLN
INTRAMUSCULAR | Status: DC | PRN
  Administered 2014-10-15: 10 mg via INTRAVENOUS

## 2014-10-15 MED ORDER — LIDOCAINE HCL (CARDIAC) 20 MG/ML IV SOLN
INTRAVENOUS | Status: AC
  Filled 2014-10-15: qty 5

## 2014-10-15 MED ORDER — SUFENTANIL CITRATE 50 MCG/ML IV SOLN
INTRAVENOUS | Status: AC
  Filled 2014-10-15: qty 1

## 2014-10-15 MED ORDER — HYDROMORPHONE HCL 1 MG/ML IJ SOLN
INTRAMUSCULAR | Status: AC
  Administered 2014-10-15: 0.5 mg via INTRAVENOUS
  Filled 2014-10-15: qty 1

## 2014-10-15 MED ORDER — GLYCOPYRROLATE 0.2 MG/ML IJ SOLN
INTRAMUSCULAR | Status: AC
  Filled 2014-10-15: qty 4

## 2014-10-15 MED ORDER — STERILE WATER FOR IRRIGATION IR SOLN
Status: DC | PRN
  Administered 2014-10-15: 3000 mL

## 2014-10-15 MED ORDER — BUPIVACAINE-EPINEPHRINE 0.25% -1:200000 IJ SOLN
INTRAMUSCULAR | Status: AC
  Filled 2014-10-15: qty 1

## 2014-10-15 MED ORDER — HYDROCODONE-ACETAMINOPHEN 5-325 MG PO TABS: 1.0000 | ORAL_TABLET | Freq: Four times a day (QID) | ORAL | Status: DC | PRN

## 2014-10-15 MED ORDER — HYDROMORPHONE HCL 1 MG/ML IJ SOLN
INTRAMUSCULAR | Status: DC | PRN
  Administered 2014-10-15 (×3): 0.5 mg via INTRAVENOUS
  Administered 2014-10-15 (×2): 1 mg via INTRAVENOUS
  Administered 2014-10-15: 0.5 mg via INTRAVENOUS

## 2014-10-15 MED ORDER — PROPOFOL 10 MG/ML IV BOLUS
INTRAVENOUS | Status: AC
  Filled 2014-10-15: qty 20

## 2014-10-15 MED ORDER — BUPIVACAINE-EPINEPHRINE 0.25% -1:200000 IJ SOLN
INTRAMUSCULAR | Status: DC | PRN
  Administered 2014-10-15: 30 mL

## 2014-10-15 MED ORDER — NEOSTIGMINE METHYLSULFATE 10 MG/10ML IV SOLN
INTRAVENOUS | Status: AC
  Filled 2014-10-15: qty 1

## 2014-10-15 MED ORDER — CEFAZOLIN SODIUM 1-5 GM-% IV SOLN
1.0000 g | Freq: Three times a day (TID) | INTRAVENOUS | Status: AC
  Administered 2014-10-15 – 2014-10-16 (×2): 1 g via INTRAVENOUS
  Filled 2014-10-15 (×2): qty 50

## 2014-10-15 MED ORDER — SODIUM CHLORIDE 0.9 % IV BOLUS (SEPSIS)
1000.0000 mL | Freq: Once | INTRAVENOUS | Status: AC
  Administered 2014-10-15: 1000 mL via INTRAVENOUS

## 2014-10-15 MED ORDER — CEFAZOLIN SODIUM-DEXTROSE 2-3 GM-% IV SOLR
INTRAVENOUS | Status: AC
  Filled 2014-10-15: qty 50

## 2014-10-15 MED ORDER — ACETAMINOPHEN 325 MG PO TABS: 650.0000 mg | ORAL_TABLET | ORAL | Status: DC | PRN

## 2014-10-15 MED ORDER — NEOSTIGMINE METHYLSULFATE 10 MG/10ML IV SOLN
INTRAVENOUS | Status: DC | PRN
  Administered 2014-10-15: 5 mg via INTRAVENOUS

## 2014-10-15 MED ORDER — ALPRAZOLAM 0.5 MG PO TABS: 0.5000 mg | ORAL_TABLET | Freq: Every evening | ORAL | Status: DC | PRN

## 2014-10-15 MED ORDER — DEXAMETHASONE SODIUM PHOSPHATE 10 MG/ML IJ SOLN
INTRAMUSCULAR | Status: AC
  Filled 2014-10-15: qty 1

## 2014-10-15 MED ORDER — ONDANSETRON HCL 4 MG/2ML IJ SOLN
INTRAMUSCULAR | Status: DC | PRN
  Administered 2014-10-15: 4 mg via INTRAVENOUS

## 2014-10-15 MED ORDER — PROPOFOL 10 MG/ML IV BOLUS
INTRAVENOUS | Status: DC | PRN
  Administered 2014-10-15: 200 mg via INTRAVENOUS
  Administered 2014-10-15: 30 mg via INTRAVENOUS

## 2014-10-15 MED ORDER — LABETALOL HCL 5 MG/ML IV SOLN
INTRAVENOUS | Status: AC
  Filled 2014-10-15: qty 4

## 2014-10-15 MED ORDER — LACTATED RINGERS IV SOLN
INTRAVENOUS | Status: DC | PRN
  Administered 2014-10-15 (×2): via INTRAVENOUS

## 2014-10-15 MED ORDER — HEPARIN SODIUM (PORCINE) 1000 UNIT/ML IJ SOLN
INTRAMUSCULAR | Status: AC
  Filled 2014-10-15: qty 1

## 2014-10-15 MED ORDER — LACTATED RINGERS IV SOLN
INTRAVENOUS | Status: DC
  Administered 2014-10-15: 1000 mL via INTRAVENOUS

## 2014-10-15 MED ORDER — MIDAZOLAM HCL 2 MG/2ML IJ SOLN
INTRAMUSCULAR | Status: AC
  Filled 2014-10-15: qty 2

## 2014-10-15 MED ORDER — MIDAZOLAM HCL 5 MG/5ML IJ SOLN
INTRAMUSCULAR | Status: DC | PRN
  Administered 2014-10-15 (×2): 1 mg via INTRAVENOUS

## 2014-10-15 MED ORDER — CEFAZOLIN SODIUM-DEXTROSE 2-3 GM-% IV SOLR
2.0000 g | INTRAVENOUS | Status: AC
  Administered 2014-10-15: 2 g via INTRAVENOUS

## 2014-10-15 MED ORDER — DIPHENHYDRAMINE HCL 12.5 MG/5ML PO ELIX: 12.5000 mg | ORAL_SOLUTION | Freq: Four times a day (QID) | ORAL | Status: DC | PRN

## 2014-10-15 MED ORDER — CIPROFLOXACIN HCL 500 MG PO TABS: 500.0000 mg | ORAL_TABLET | Freq: Two times a day (BID) | ORAL | Status: DC

## 2014-10-15 MED ORDER — LACTATED RINGERS IV SOLN
INTRAVENOUS | Status: DC | PRN
  Administered 2014-10-15: 1000 mL

## 2014-10-15 MED ORDER — KCL IN DEXTROSE-NACL 20-5-0.45 MEQ/L-%-% IV SOLN
INTRAVENOUS | Status: AC
  Administered 2014-10-15: 1000 mL via INTRAVENOUS
  Filled 2014-10-15: qty 1000

## 2014-10-15 MED ORDER — HYDROMORPHONE HCL 1 MG/ML IJ SOLN
0.2500 mg | INTRAMUSCULAR | Status: DC | PRN
  Administered 2014-10-15 (×4): 0.5 mg via INTRAVENOUS

## 2014-10-15 MED ORDER — KETOROLAC TROMETHAMINE 15 MG/ML IJ SOLN
15.0000 mg | Freq: Four times a day (QID) | INTRAMUSCULAR | Status: DC
  Administered 2014-10-15 – 2014-10-16 (×3): 15 mg via INTRAVENOUS
  Filled 2014-10-15 (×6): qty 1

## 2014-10-15 MED ORDER — MORPHINE SULFATE 2 MG/ML IJ SOLN
2.0000 mg | INTRAMUSCULAR | Status: DC | PRN
  Administered 2014-10-15 – 2014-10-16 (×2): 4 mg via INTRAVENOUS
  Filled 2014-10-15 (×2): qty 2

## 2014-10-15 MED ORDER — ROCURONIUM BROMIDE 100 MG/10ML IV SOLN
INTRAVENOUS | Status: AC
  Filled 2014-10-15: qty 1

## 2014-10-15 MED ORDER — DIPHENHYDRAMINE HCL 50 MG/ML IJ SOLN: 12.5000 mg | Freq: Four times a day (QID) | INTRAMUSCULAR | Status: DC | PRN

## 2014-10-15 MED ORDER — MEPERIDINE HCL 50 MG/ML IJ SOLN
INTRAMUSCULAR | Status: AC
  Filled 2014-10-15: qty 1

## 2014-10-15 SURGICAL SUPPLY — 48 items
CABLE HIGH FREQUENCY MONO STRZ (ELECTRODE) ×3 IMPLANT
CATH FOLEY 2WAY SLVR 18FR 30CC (CATHETERS) ×3 IMPLANT
CATH ROBINSON RED A/P 16FR (CATHETERS) ×3 IMPLANT
CATH ROBINSON RED A/P 8FR (CATHETERS) ×3 IMPLANT
CATH TIEMANN FOLEY 18FR 5CC (CATHETERS) ×3 IMPLANT
CHLORAPREP W/TINT 26ML (MISCELLANEOUS) ×3 IMPLANT
CLIP LIGATING HEM O LOK PURPLE (MISCELLANEOUS) ×9 IMPLANT
CLOTH BEACON ORANGE TIMEOUT ST (SAFETY) ×3 IMPLANT
COVER SURGICAL LIGHT HANDLE (MISCELLANEOUS) ×3 IMPLANT
COVER TIP SHEARS 8 DVNC (MISCELLANEOUS) ×2 IMPLANT
COVER TIP SHEARS 8MM DA VINCI (MISCELLANEOUS) ×1
CUTTER ECHEON FLEX ENDO 45 340 (ENDOMECHANICALS) ×3 IMPLANT
DECANTER SPIKE VIAL GLASS SM (MISCELLANEOUS) ×2 IMPLANT
DRAPE SURG IRRIG POUCH 19X23 (DRAPES) ×3 IMPLANT
DRSG TEGADERM 4X4.75 (GAUZE/BANDAGES/DRESSINGS) ×3 IMPLANT
DRSG TEGADERM 6X8 (GAUZE/BANDAGES/DRESSINGS) ×6 IMPLANT
ELECT REM PT RETURN 9FT ADLT (ELECTROSURGICAL) ×3
ELECTRODE REM PT RTRN 9FT ADLT (ELECTROSURGICAL) ×2 IMPLANT
GLOVE BIO SURGEON STRL SZ 6.5 (GLOVE) ×3 IMPLANT
GLOVE BIOGEL M STRL SZ7.5 (GLOVE) ×6 IMPLANT
GOWN STRL REUS W/TWL LRG LVL3 (GOWN DISPOSABLE) ×9 IMPLANT
HOLDER FOLEY CATH W/STRAP (MISCELLANEOUS) ×3 IMPLANT
IV LACTATED RINGERS 1000ML (IV SOLUTION) ×2 IMPLANT
KIT ACCESSORY DA VINCI DISP (KITS) ×1
KIT ACCESSORY DVNC DISP (KITS) ×2 IMPLANT
LIQUID BAND (GAUZE/BANDAGES/DRESSINGS) IMPLANT
MANIFOLD NEPTUNE II (INSTRUMENTS) ×3 IMPLANT
NDL SAFETY ECLIPSE 18X1.5 (NEEDLE) ×2 IMPLANT
NEEDLE HYPO 18GX1.5 SHARP (NEEDLE) ×3
PACK ROBOT UROLOGY CUSTOM (CUSTOM PROCEDURE TRAY) ×3 IMPLANT
RELOAD GREEN ECHELON 45 (STAPLE) ×3 IMPLANT
SET TUBE IRRIG SUCTION NO TIP (IRRIGATION / IRRIGATOR) ×3 IMPLANT
SOLUTION ELECTROLUBE (MISCELLANEOUS) ×3 IMPLANT
SUT ETHILON 3 0 PS 1 (SUTURE) ×3 IMPLANT
SUT MNCRL 3 0 RB1 (SUTURE) ×2 IMPLANT
SUT MNCRL 3 0 VIOLET RB1 (SUTURE) ×2 IMPLANT
SUT MNCRL AB 4-0 PS2 18 (SUTURE) ×6 IMPLANT
SUT MONOCRYL 3 0 RB1 (SUTURE) ×4
SUT VIC AB 0 CT1 27 (SUTURE) ×3
SUT VIC AB 0 CT1 27XBRD ANTBC (SUTURE) ×2 IMPLANT
SUT VIC AB 0 UR5 27 (SUTURE) ×3 IMPLANT
SUT VIC AB 2-0 SH 27 (SUTURE) ×3
SUT VIC AB 2-0 SH 27X BRD (SUTURE) ×2 IMPLANT
SUT VICRYL 0 UR6 27IN ABS (SUTURE) ×6 IMPLANT
SYR 27GX1/2 1ML LL SAFETY (SYRINGE) ×3 IMPLANT
TOWEL OR 17X26 10 PK STRL BLUE (TOWEL DISPOSABLE) ×3 IMPLANT
TOWEL OR NON WOVEN STRL DISP B (DISPOSABLE) ×3 IMPLANT
WATER STERILE IRR 1500ML POUR (IV SOLUTION) ×4 IMPLANT

## 2014-10-15 NOTE — Plan of Care (Signed)
Problem: Phase I Progression Outcomes Goal: Pain controlled with appropriate interventions Outcome: Completed/Met Date Met:  10/15/14     

## 2014-10-15 NOTE — Plan of Care (Signed)
Problem: Phase I Progression Outcomes Goal: Hemodynamically stable Outcome: Completed/Met Date Met:  10/15/14     

## 2014-10-15 NOTE — Interval H&P Note (Signed)
History and Physical Interval Note:  10/15/2014 10:10 AM  Austin Hopkins  has presented today for surgery, with the diagnosis of PROSTATE CANCER  The various methods of treatment have been discussed with the patient and family. After consideration of risks, benefits and other options for treatment, the patient has consented to  Procedure(s): ROBOTIC ASSISTED LAPAROSCOPIC RADICAL PROSTATECTOMY LEVEL 2 (N/A) BILATERAL LYMPHADENECTOMY (Bilateral) as a surgical intervention .  The patient's history has been reviewed, patient examined, no change in status, stable for surgery.  I have reviewed the patient's chart and labs.  Questions were answered to the patient's satisfaction.     Mazikeen Hehn,LES

## 2014-10-15 NOTE — Progress Notes (Signed)
Patient ID: Austin Hopkins, male   DOB: 05/02/1961, 53 y.o.   MRN: [id number]  Post-op note  Subjective: The patient is doing well.  No complaints.  Objective: Vital signs in last 24 hours: Temp:  [97 F (36.1 C)-98.8 F (37.1 C)] 98.8 F (37.1 C) (12/07 1635) Pulse Rate:  [73-82] 75 (12/07 1635) Resp:  [11-17] 14 (12/07 1635) BP: (139-178)/(72-84) 139/83 mmHg (12/07 1635) SpO2:  [96 %-100 %] 99 % (12/07 1635) Weight:  [103.42 kg (228 lb)] 103.42 kg (228 lb) (12/07 0912)  Intake/Output from previous day:   Intake/Output this shift: Total I/O In: 1900 [I.V.:1900] Out: 560 [Urine:125; Drains:85; Blood:350]  Physical Exam:  General: Alert and oriented. Abdomen: Soft, Nondistended. Incisions: Clean and dry.  Lab Results:  Recent Labs  10/15/14 1544  HGB 13.6  HCT 40.9    Assessment/Plan: POD#0   1) Continue to monitor, ambulate, IS   Pryor Curia. MD   LOS: 0 days   Papa [place],LES 10/15/2014, 6:09 PM

## 2014-10-15 NOTE — Plan of Care (Signed)
Problem: Phase I Progression Outcomes Goal: Adequate I & O Outcome: Completed/Met Date Met:  10/15/14

## 2014-10-15 NOTE — Op Note (Signed)

## 2014-10-15 NOTE — Discharge Instructions (Signed)

## 2014-10-15 NOTE — Anesthesia Postprocedure Evaluation (Signed)
  Anesthesia Post-op Note  Patient: Austin Hopkins  Procedure(s) Performed: Procedure(s) (LRB): ROBOTIC ASSISTED LAPAROSCOPIC RADICAL PROSTATECTOMY LEVEL 2 (N/A) BILATERAL LYMPHADENECTOMY (Bilateral)  Patient Location: PACU  Anesthesia Type: General  Level of Consciousness: awake and alert   Airway and Oxygen Therapy: Patient Spontanous Breathing  Post-op Pain: mild  Post-op Assessment: Post-op Vital signs reviewed, Patient's Cardiovascular Status Stable, Respiratory Function Stable, Patent Airway and No signs of Nausea or vomiting  Last Vitals:  Filed Vitals:   10/15/14 1615  BP: 155/78  Pulse: 73  Temp:   Resp: 14    Post-op Vital Signs: stable   Complications: No apparent anesthesia complications

## 2014-10-15 NOTE — Plan of Care (Signed)
Problem: Phase I Progression Outcomes Goal: Incision/dressing intact Outcome: Completed/Met Date Met:  10/15/14

## 2014-10-15 NOTE — Plan of Care (Signed)
Problem: Phase I Progression Outcomes Goal: NPO except ice chips or as ordered Outcome: Completed/Met Date Met:  10/15/14 Pt on clear liquid diet

## 2014-10-15 NOTE — Transfer of Care (Signed)
Immediate Anesthesia Transfer of Care Note  Patient: Austin Hopkins  Procedure(s) Performed: Procedure(s): ROBOTIC ASSISTED LAPAROSCOPIC RADICAL PROSTATECTOMY LEVEL 2 (N/A) BILATERAL LYMPHADENECTOMY (Bilateral)  Patient Location: PACU  Anesthesia Type:General  Level of Consciousness: awake, alert  and oriented  Airway & Oxygen Therapy: Patient Spontanous Breathing and Patient connected to face mask oxygen  Post-op Assessment: Report given to PACU RN and Post -op Vital signs reviewed and stable  Post vital signs: Reviewed and stable  Complications: No apparent anesthesia complications

## 2014-10-15 NOTE — Plan of Care (Signed)
Problem: Phase I Progression Outcomes Goal: Foley/JP patent Outcome: Completed/Met Date Met:  10/15/14

## 2014-10-15 NOTE — Plan of Care (Signed)
Problem: Phase I Progression Outcomes Goal: Initial discharge plan identified Outcome: Completed/Met Date Met:  10/15/14

## 2014-10-16 ENCOUNTER — Encounter (HOSPITAL_COMMUNITY): Payer: Self-pay | Admitting: Urology

## 2014-10-16 LAB — HEMOGLOBIN AND HEMATOCRIT, BLOOD
HEMATOCRIT: 36.9 % — AB (ref 39.0–52.0)
Hemoglobin: 12.7 g/dL — ABNORMAL LOW (ref 13.0–17.0)

## 2014-10-16 MED ORDER — BISACODYL 10 MG RE SUPP: 10.0000 mg | Freq: Once | RECTAL | Status: DC

## 2014-10-16 NOTE — Progress Notes (Signed)
D/C instructions reviewed w/ pt. Pt verbalizes understanding and all questions answered. Pt d/c in stable condition to family's car in possession of d/c instructions, scripts, and all personal belongings. Pt d/c in w/c by NT.

## 2014-10-16 NOTE — Discharge Summary (Signed)
  Date of admission: 10/15/2014  Date of discharge: 10/16/2014  Admission diagnosis: Prostate Cancer  Discharge diagnosis: Prostate Cancer  History and Physical: For full details, please see admission history and physical. Briefly, Austin Hopkins is a 53 y.o. gentleman with localized prostate cancer.  After discussing management/treatment options, he elected to proceed with surgical treatment.  Hospital Course: Austin Hopkins was taken to the operating room on 10/15/2014 and underwent a robotic assisted laparoscopic radical prostatectomy. He tolerated this procedure well and without complications. Postoperatively, he was able to be transferred to a regular hospital room following recovery from anesthesia.  He was able to begin ambulating the night of surgery. He remained hemodynamically stable overnight.  He had excellent urine output with appropriately minimal output from his pelvic drain and his pelvic drain was removed on POD #1.  He was transitioned to oral pain medication, tolerated a clear liquid diet, and had met all discharge criteria and was able to be discharged home later on POD#1.  Laboratory values:  Recent Labs  10/15/14 1544 10/16/14 0437  HGB 13.6 12.7*  HCT 40.9 36.9*    Disposition: Home  Discharge instruction: He was instructed to be ambulatory but to refrain from heavy lifting, strenuous activity, or driving. He was instructed on urethral catheter care.  Discharge medications:     Medication List    STOP taking these medications        eszopiclone 2 MG Tabs tablet  Commonly known as:  LUNESTA     HYDROcodone-ibuprofen 7.5-200 MG per tablet  Commonly known as:  VICOPROFEN      TAKE these medications        ALPRAZolam 0.5 MG tablet  Commonly known as:  XANAX  Take 1 tablet by mouth at bedtime as needed for sleep. For anxiety     ciprofloxacin 500 MG tablet  Commonly known as:  CIPRO  Take 1 tablet (500 mg total) by mouth 2 (two) times daily. Start day  prior to office visit for foley removal     HYDROcodone-acetaminophen 5-325 MG per tablet  Commonly known as:  NORCO  Take 1-2 tablets by mouth every 6 (six) hours as needed.        Followup: He will followup in 1 week for catheter removal and to discuss his surgical pathology results.

## 2014-10-16 NOTE — Progress Notes (Addendum)
Patient ID: Austin Hopkins, male   DOB: Jul 09, 1961, 53 y.o.   MRN: [id number]  1 Day Post-Op Subjective: The patient is doing well.  No nausea or vomiting. Pain is adequately controlled.  Objective: Vital signs in last 24 hours: Temp:  [97 F (36.1 C)-98.8 F (37.1 C)] 98.3 F (36.8 C) (12/08 9470) Pulse Rate:  [61-82] 61 (12/08 0614) Resp:  [11-18] 14 (12/08 0614) BP: (125-178)/(57-84) 128/65 mmHg (12/08 0614) SpO2:  [96 %-100 %] 96 % (12/08 0614) Weight:  [103.42 kg (228 lb)] 103.42 kg (228 lb) (12/07 0912)  Intake/Output from previous day: 12/07 0701 - 12/08 0700 In: 4930 [P.O.:920; I.V.:3910; IV Piggyback:100] Out: 2960 [Urine:2350; Drains:260; Blood:350] Intake/Output this shift:    Physical Exam:  General: Alert and oriented. GI: Soft, Nondistended. Incisions: Clean, dry, and intact Urine: Clear   Lab Results:  Recent Labs  10/15/14 1544 10/16/14 0437  HGB 13.6 12.7*  HCT 40.9 36.9*      Assessment/Plan: POD# 1 s/p robotic prostatectomy.  1) SL IVF 2) Ambulate, Incentive spirometry 3) Transition to oral pain medication 4) Dulcolax suppository 5) D/C pelvic drain 6) Plan for likely discharge later today   Pryor Curia. MD   LOS: 1 day   Jaselynn Tamas,LES 10/16/2014, 7:01 AM

## 2014-10-16 NOTE — Plan of Care (Signed)
Problem: Phase I Progression Outcomes Goal: Walk in halls when awake from anesthesia Outcome: Completed/Met Date Met:  10/16/14 Pt walked in hallway 2x this evening

## 2014-11-09 HISTORY — PX: FRACTURE SURGERY: SHX138

## 2014-11-22 ENCOUNTER — Other Ambulatory Visit (HOSPITAL_COMMUNITY): Payer: Self-pay | Admitting: Urology

## 2014-11-22 DIAGNOSIS — R600 Localized edema: Secondary | ICD-10-CM

## 2014-11-23 ENCOUNTER — Ambulatory Visit (HOSPITAL_COMMUNITY)
Admission: RE | Admit: 2014-11-23 | Discharge: 2014-11-23 | Disposition: A | Discharge status: Home / Self Care | Payer: 59 | Source: Ambulatory Visit | Attending: Urology | Admitting: Urology

## 2014-11-23 DIAGNOSIS — R600 Localized edema: Secondary | ICD-10-CM | POA: Insufficient documentation

## 2014-11-23 DIAGNOSIS — M7989 Other specified soft tissue disorders: Secondary | ICD-10-CM

## 2014-11-23 NOTE — Progress Notes (Signed)
VASCULAR LAB PRELIMINARY  PRELIMINARY  PRELIMINARY  PRELIMINARY  Left lower extremity venous duplex completed.    Preliminary report:  Left:  No evidence of DVT, superficial thrombosis, or Baker's cyst.   Kino Dunsworth, RVT 11/23/2014, 9:55 AM

## 2015-01-11 ENCOUNTER — Emergency Department (HOSPITAL_COMMUNITY): Payer: 59

## 2015-01-11 ENCOUNTER — Inpatient Hospital Stay (HOSPITAL_COMMUNITY): Payer: 59

## 2015-01-11 ENCOUNTER — Encounter (HOSPITAL_COMMUNITY): Payer: Self-pay | Admitting: Neurology

## 2015-01-11 ENCOUNTER — Inpatient Hospital Stay (HOSPITAL_COMMUNITY)
Admission: EM | Admit: 2015-01-11 | Discharge: 2015-01-15 | DRG: 964 | Disposition: A | Discharge status: Home / Self Care | Payer: 59 | Attending: General Surgery | Admitting: General Surgery

## 2015-01-11 DIAGNOSIS — S62201A Unspecified fracture of first metacarpal bone, right hand, initial encounter for closed fracture: Secondary | ICD-10-CM | POA: Diagnosis present

## 2015-01-11 DIAGNOSIS — S02401A Maxillary fracture, unspecified, initial encounter for closed fracture: Secondary | ICD-10-CM | POA: Diagnosis present

## 2015-01-11 DIAGNOSIS — S63257A Unspecified dislocation of left little finger, initial encounter: Secondary | ICD-10-CM | POA: Diagnosis present

## 2015-01-11 DIAGNOSIS — S02402A Zygomatic fracture, unspecified, initial encounter for closed fracture: Secondary | ICD-10-CM | POA: Diagnosis present

## 2015-01-11 DIAGNOSIS — S12400A Unspecified displaced fracture of fifth cervical vertebra, initial encounter for closed fracture: Secondary | ICD-10-CM | POA: Diagnosis present

## 2015-01-11 DIAGNOSIS — S61412A Laceration without foreign body of left hand, initial encounter: Secondary | ICD-10-CM | POA: Diagnosis present

## 2015-01-11 DIAGNOSIS — S0292XA Unspecified fracture of facial bones, initial encounter for closed fracture: Secondary | ICD-10-CM | POA: Diagnosis present

## 2015-01-11 DIAGNOSIS — S2231XA Fracture of one rib, right side, initial encounter for closed fracture: Secondary | ICD-10-CM

## 2015-01-11 DIAGNOSIS — S12500A Unspecified displaced fracture of sixth cervical vertebra, initial encounter for closed fracture: Secondary | ICD-10-CM | POA: Diagnosis present

## 2015-01-11 DIAGNOSIS — F17210 Nicotine dependence, cigarettes, uncomplicated: Secondary | ICD-10-CM | POA: Diagnosis present

## 2015-01-11 DIAGNOSIS — S066XAA Traumatic subarachnoid hemorrhage with loss of consciousness status unknown, initial encounter: Secondary | ICD-10-CM | POA: Diagnosis not present

## 2015-01-11 DIAGNOSIS — M25511 Pain in right shoulder: Secondary | ICD-10-CM

## 2015-01-11 DIAGNOSIS — G8911 Acute pain due to trauma: Secondary | ICD-10-CM

## 2015-01-11 DIAGNOSIS — F419 Anxiety disorder, unspecified: Secondary | ICD-10-CM | POA: Diagnosis present

## 2015-01-11 DIAGNOSIS — Y92411 Interstate highway as the place of occurrence of the external cause: Secondary | ICD-10-CM

## 2015-01-11 DIAGNOSIS — Z79891 Long term (current) use of opiate analgesic: Secondary | ICD-10-CM | POA: Diagnosis not present

## 2015-01-11 DIAGNOSIS — T1490XA Injury, unspecified, initial encounter: Secondary | ICD-10-CM

## 2015-01-11 DIAGNOSIS — S066X9A Traumatic subarachnoid hemorrhage with loss of consciousness of unspecified duration, initial encounter: Principal | ICD-10-CM | POA: Diagnosis present

## 2015-01-11 DIAGNOSIS — J939 Pneumothorax, unspecified: Secondary | ICD-10-CM

## 2015-01-11 DIAGNOSIS — Z79899 Other long term (current) drug therapy: Secondary | ICD-10-CM

## 2015-01-11 DIAGNOSIS — S0003XA Contusion of scalp, initial encounter: Secondary | ICD-10-CM | POA: Diagnosis present

## 2015-01-11 DIAGNOSIS — S270XXA Traumatic pneumothorax, initial encounter: Secondary | ICD-10-CM | POA: Diagnosis present

## 2015-01-11 DIAGNOSIS — J942 Hemothorax: Secondary | ICD-10-CM | POA: Diagnosis present

## 2015-01-11 DIAGNOSIS — Z8546 Personal history of malignant neoplasm of prostate: Secondary | ICD-10-CM

## 2015-01-11 DIAGNOSIS — S2241XA Multiple fractures of ribs, right side, initial encounter for closed fracture: Secondary | ICD-10-CM | POA: Diagnosis present

## 2015-01-11 DIAGNOSIS — S129XXA Fracture of neck, unspecified, initial encounter: Secondary | ICD-10-CM

## 2015-01-11 DIAGNOSIS — S2249XA Multiple fractures of ribs, unspecified side, initial encounter for closed fracture: Secondary | ICD-10-CM | POA: Diagnosis present

## 2015-01-11 DIAGNOSIS — M79644 Pain in right finger(s): Secondary | ICD-10-CM

## 2015-01-11 DIAGNOSIS — S2239XA Fracture of one rib, unspecified side, initial encounter for closed fracture: Secondary | ICD-10-CM | POA: Diagnosis present

## 2015-01-11 DIAGNOSIS — S143XXA Injury of brachial plexus, initial encounter: Secondary | ICD-10-CM | POA: Diagnosis present

## 2015-01-11 DIAGNOSIS — D62 Acute posthemorrhagic anemia: Secondary | ICD-10-CM | POA: Diagnosis not present

## 2015-01-11 DIAGNOSIS — Z09 Encounter for follow-up examination after completed treatment for conditions other than malignant neoplasm: Secondary | ICD-10-CM

## 2015-01-11 DIAGNOSIS — R29898 Other symptoms and signs involving the musculoskeletal system: Secondary | ICD-10-CM

## 2015-01-11 LAB — CBC
HCT: 42.8 % (ref 39.0–52.0)
Hemoglobin: 15 g/dL (ref 13.0–17.0)
MCH: 34.8 pg — ABNORMAL HIGH (ref 26.0–34.0)
MCHC: 35 g/dL (ref 30.0–36.0)
MCV: 99.3 fL (ref 78.0–100.0)
PLATELETS: 207 10*3/uL (ref 150–400)
RBC: 4.31 MIL/uL (ref 4.22–5.81)
RDW: 12.7 % (ref 11.5–15.5)
WBC: 10.7 10*3/uL — AB (ref 4.0–10.5)

## 2015-01-11 LAB — COMPREHENSIVE METABOLIC PANEL
ALT: 41 U/L (ref 0–53)
ANION GAP: 14 (ref 5–15)
AST: 57 U/L — ABNORMAL HIGH (ref 0–37)
Albumin: 3.9 g/dL (ref 3.5–5.2)
Alkaline Phosphatase: 39 U/L (ref 39–117)
BUN: 7 mg/dL (ref 6–23)
CALCIUM: 8.5 mg/dL (ref 8.4–10.5)
CHLORIDE: 102 mmol/L (ref 96–112)
CO2: 20 mmol/L (ref 19–32)
Creatinine, Ser: 0.89 mg/dL (ref 0.50–1.35)
GFR calc non Af Amer: >90 mL/min (ref >90)
Glucose, Bld: 117 mg/dL — ABNORMAL HIGH (ref 70–99)
POTASSIUM: 4 mmol/L (ref 3.5–5.1)
Sodium: 136 mmol/L (ref 135–145)
TOTAL PROTEIN: 6.1 g/dL (ref 6.0–8.3)
Total Bilirubin: 1.2 mg/dL (ref 0.3–1.2)

## 2015-01-11 LAB — CDS SEROLOGY

## 2015-01-11 LAB — ETHANOL: Alcohol, Ethyl (B): 126 mg/dL — ABNORMAL HIGH (ref 0–9)

## 2015-01-11 LAB — PROTIME-INR
INR: 0.97 (ref 0.00–1.49)
PROTHROMBIN TIME: 13 s (ref 11.6–15.2)

## 2015-01-11 LAB — SAMPLE TO BLOOD BANK

## 2015-01-11 MED ORDER — ONDANSETRON HCL 4 MG/2ML IJ SOLN
4.0000 mg | Freq: Once | INTRAMUSCULAR | Status: AC
  Administered 2015-01-11: 4 mg via INTRAVENOUS
  Filled 2015-01-11: qty 2

## 2015-01-11 MED ORDER — LORAZEPAM 2 MG/ML IJ SOLN
1.0000 mg | Freq: Once | INTRAMUSCULAR | Status: AC
  Administered 2015-01-11: 1 mg via INTRAVENOUS
  Filled 2015-01-11: qty 1

## 2015-01-11 MED ORDER — HYDROMORPHONE HCL 1 MG/ML IJ SOLN
1.0000 mg | INTRAMUSCULAR | Status: DC | PRN
  Administered 2015-01-12 – 2015-01-14 (×12): 1 mg via INTRAVENOUS
  Filled 2015-01-11 (×2): qty 1

## 2015-01-11 MED ORDER — DEXTROSE-NACL 5-0.9 % IV SOLN
INTRAVENOUS | Status: DC
  Administered 2015-01-11 – 2015-01-12 (×2): via INTRAVENOUS

## 2015-01-11 MED ORDER — ONDANSETRON HCL 4 MG/2ML IJ SOLN
INTRAMUSCULAR | Status: AC
  Filled 2015-01-11: qty 2

## 2015-01-11 MED ORDER — LIDOCAINE HCL (PF) 1 % IJ SOLN: 10.0000 mL | Freq: Once | INTRAMUSCULAR | Status: DC

## 2015-01-11 MED ORDER — SODIUM CHLORIDE 0.9 % IV BOLUS (SEPSIS)
1000.0000 mL | Freq: Once | INTRAVENOUS | Status: AC
  Administered 2015-01-11: 1000 mL via INTRAVENOUS

## 2015-01-11 MED ORDER — HYDROMORPHONE HCL 1 MG/ML IJ SOLN
1.0000 mg | INTRAMUSCULAR | Status: DC | PRN
  Administered 2015-01-11 – 2015-01-13 (×6): 1 mg via INTRAVENOUS
  Filled 2015-01-11 (×17): qty 1

## 2015-01-11 MED ORDER — FENTANYL CITRATE 0.05 MG/ML IJ SOLN
INTRAMUSCULAR | Status: AC
  Filled 2015-01-11: qty 2

## 2015-01-11 MED ORDER — IOHEXOL 300 MG/ML  SOLN
100.0000 mL | Freq: Once | INTRAMUSCULAR | Status: AC | PRN
  Administered 2015-01-11: 100 mL via INTRAVENOUS

## 2015-01-11 MED ORDER — PANTOPRAZOLE SODIUM 40 MG PO TBEC
40.0000 mg | DELAYED_RELEASE_TABLET | Freq: Every day | ORAL | Status: DC
  Administered 2015-01-13: 40 mg via ORAL
  Filled 2015-01-11: qty 1

## 2015-01-11 MED ORDER — TETANUS-DIPHTH-ACELL PERTUSSIS 5-2.5-18.5 LF-MCG/0.5 IM SUSP
0.5000 mL | Freq: Once | INTRAMUSCULAR | Status: AC
  Administered 2015-01-11: 0.5 mL via INTRAMUSCULAR

## 2015-01-11 MED ORDER — ONDANSETRON HCL 4 MG/2ML IJ SOLN
4.0000 mg | Freq: Four times a day (QID) | INTRAMUSCULAR | Status: DC | PRN
  Administered 2015-01-12 (×3): 4 mg via INTRAVENOUS
  Filled 2015-01-11 (×3): qty 2

## 2015-01-11 MED ORDER — PANTOPRAZOLE SODIUM 40 MG IV SOLR
40.0000 mg | Freq: Every day | INTRAVENOUS | Status: DC
  Administered 2015-01-12: 40 mg via INTRAVENOUS
  Filled 2015-01-11 (×2): qty 40

## 2015-01-11 MED ORDER — FENTANYL CITRATE 0.05 MG/ML IJ SOLN
25.0000 ug | Freq: Once | INTRAMUSCULAR | Status: AC
  Administered 2015-01-11: 25 ug via INTRAVENOUS

## 2015-01-11 MED ORDER — TETANUS-DIPHTH-ACELL PERTUSSIS 5-2.5-18.5 LF-MCG/0.5 IM SUSP
INTRAMUSCULAR | Status: AC
  Filled 2015-01-11: qty 0.5

## 2015-01-11 MED ORDER — ONDANSETRON HCL 4 MG/2ML IJ SOLN
4.0000 mg | Freq: Once | INTRAMUSCULAR | Status: AC
  Administered 2015-01-11: 4 mg via INTRAVENOUS

## 2015-01-11 MED ORDER — ONDANSETRON HCL 4 MG PO TABS: 4.0000 mg | ORAL_TABLET | Freq: Four times a day (QID) | ORAL | Status: DC | PRN

## 2015-01-11 MED ORDER — FENTANYL CITRATE 0.05 MG/ML IJ SOLN: 50.0000 ug | Freq: Once | INTRAMUSCULAR | Status: DC

## 2015-01-11 MED ORDER — IOHEXOL 350 MG/ML SOLN
50.0000 mL | Freq: Once | INTRAVENOUS | Status: AC | PRN
  Administered 2015-01-11: 50 mL via INTRAVENOUS

## 2015-01-11 MED ORDER — HYDROMORPHONE HCL 1 MG/ML IJ SOLN
1.0000 mg | Freq: Once | INTRAMUSCULAR | Status: AC
  Administered 2015-01-11: 1 mg via INTRAVENOUS
  Filled 2015-01-11: qty 1
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
## 2015-01-11 NOTE — ED Notes (Addendum)
Patient transported to CT 
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
## 2015-01-11 NOTE — ED Notes (Signed)
Pt spoke on phone to wife who is on her way from [place].

## 2015-01-12 ENCOUNTER — Inpatient Hospital Stay (HOSPITAL_COMMUNITY): Payer: 59

## 2015-01-12 LAB — COMPREHENSIVE METABOLIC PANEL
ALBUMIN: 3.8 g/dL (ref 3.5–5.2)
ALK PHOS: 37 U/L — AB (ref 39–117)
ALT: 49 U/L (ref 0–53)
ANION GAP: 12 (ref 5–15)
AST: 104 U/L — ABNORMAL HIGH (ref 0–37)
BILIRUBIN TOTAL: 1 mg/dL (ref 0.3–1.2)
BUN: 9 mg/dL (ref 6–23)
CO2: 22 mmol/L (ref 19–32)
CREATININE: 0.89 mg/dL (ref 0.50–1.35)
Calcium: 8.8 mg/dL (ref 8.4–10.5)
Chloride: 99 mmol/L (ref 96–112)
GFR calc non Af Amer: >90 mL/min (ref >90)
Glucose, Bld: 175 mg/dL — ABNORMAL HIGH (ref 70–99)
POTASSIUM: 4 mmol/L (ref 3.5–5.1)
SODIUM: 133 mmol/L — AB (ref 135–145)
TOTAL PROTEIN: 6.3 g/dL (ref 6.0–8.3)

## 2015-01-12 LAB — CBC
HCT: 39.3 % (ref 39.0–52.0)
Hemoglobin: 13.9 g/dL (ref 13.0–17.0)
MCH: 34.6 pg — ABNORMAL HIGH (ref 26.0–34.0)
MCHC: 35.4 g/dL (ref 30.0–36.0)
MCV: 97.8 fL (ref 78.0–100.0)
Platelets: 182 10*3/uL (ref 150–400)
RBC: 4.02 MIL/uL — AB (ref 4.22–5.81)
RDW: 12.9 % (ref 11.5–15.5)
WBC: 11 10*3/uL — ABNORMAL HIGH (ref 4.0–10.5)

## 2015-01-12 LAB — MRSA PCR SCREENING: MRSA BY PCR: NEGATIVE

## 2015-01-12 MED ORDER — CIPROFLOXACIN-DEXAMETHASONE 0.3-0.1 % OT SUSP
4.0000 [drp] | Freq: Two times a day (BID) | OTIC | Status: DC
  Administered 2015-01-12 – 2015-01-15 (×7): 4 [drp] via OTIC
  Filled 2015-01-12: qty 7.5

## 2015-01-12 MED ORDER — OXYCODONE-ACETAMINOPHEN 5-325 MG PO TABS
1.0000 | ORAL_TABLET | ORAL | Status: DC | PRN
  Administered 2015-01-12 (×2): 1 via ORAL
  Administered 2015-01-13 – 2015-01-14 (×5): 2 via ORAL
  Filled 2015-01-12 (×4): qty 2
  Filled 2015-01-12: qty 1
  Filled 2015-01-12 (×2): qty 2

## 2015-01-12 MED ORDER — ONDANSETRON HCL 4 MG/2ML IJ SOLN
INTRAMUSCULAR | Status: AC
  Administered 2015-01-12: 4 mg via INTRAVENOUS
  Filled 2015-01-12: qty 2

## 2015-01-12 MED ORDER — PROMETHAZINE HCL 25 MG/ML IJ SOLN: 6.2500 mg | Freq: Four times a day (QID) | INTRAMUSCULAR | Status: DC | PRN

## 2015-01-12 MED ORDER — GADOBENATE DIMEGLUMINE 529 MG/ML IV SOLN
19.0000 mL | Freq: Once | INTRAVENOUS | Status: AC | PRN
  Administered 2015-01-12: 19 mL via INTRAVENOUS

## 2015-01-12 MED ORDER — FENTANYL CITRATE 0.05 MG/ML IJ SOLN: 50.0000 ug | Freq: Once | INTRAMUSCULAR | Status: DC
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
## 2015-01-12 NOTE — ED Notes (Signed)
[facility] called and stated to hold pt until another MD assessed pt.
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
## 2015-01-12 NOTE — ED Notes (Signed)
Attempted to call report
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
## 2015-01-12 NOTE — ED Notes (Signed)
ENT at bedside
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
## 2015-01-12 NOTE — ED Notes (Signed)
Hand at bedside.

## 2015-01-13 ENCOUNTER — Inpatient Hospital Stay (HOSPITAL_COMMUNITY): Payer: 59

## 2015-01-13 LAB — BASIC METABOLIC PANEL
Anion gap: 7 (ref 5–15)
BUN: 8 mg/dL (ref 6–23)
CALCIUM: 8.4 mg/dL (ref 8.4–10.5)
CO2: 26 mmol/L (ref 19–32)
Chloride: 102 mmol/L (ref 96–112)
Creatinine, Ser: 0.8 mg/dL (ref 0.50–1.35)
Glucose, Bld: 123 mg/dL — ABNORMAL HIGH (ref 70–99)
Potassium: 3.4 mmol/L — ABNORMAL LOW (ref 3.5–5.1)
Sodium: 135 mmol/L (ref 135–145)

## 2015-01-13 LAB — CBC
HCT: 34.2 % — ABNORMAL LOW (ref 39.0–52.0)
Hemoglobin: 11.9 g/dL — ABNORMAL LOW (ref 13.0–17.0)
MCH: 34.4 pg — AB (ref 26.0–34.0)
MCHC: 34.8 g/dL (ref 30.0–36.0)
MCV: 98.8 fL (ref 78.0–100.0)
Platelets: 141 10*3/uL — ABNORMAL LOW (ref 150–400)
RBC: 3.46 MIL/uL — ABNORMAL LOW (ref 4.22–5.81)
RDW: 13 % (ref 11.5–15.5)
WBC: 7 10*3/uL (ref 4.0–10.5)

## 2015-01-13 MED ORDER — POTASSIUM CHLORIDE CRYS ER 20 MEQ PO TBCR
20.0000 meq | EXTENDED_RELEASE_TABLET | Freq: Once | ORAL | Status: AC
  Administered 2015-01-13: 20 meq via ORAL
  Filled 2015-01-13: qty 1

## 2015-01-13 MED ORDER — POTASSIUM CHLORIDE 20 MEQ/15ML (10%) PO SOLN: 20.0000 meq | Freq: Once | ORAL | Status: DC

## 2015-01-13 MED ORDER — METHOCARBAMOL 500 MG PO TABS
500.0000 mg | ORAL_TABLET | Freq: Three times a day (TID) | ORAL | Status: DC | PRN
Start: 2015-01-13 — End: 2015-01-15
  Administered 2015-01-13 – 2015-01-14 (×3): 500 mg via ORAL
  Filled 2015-01-13 (×3): qty 1

## 2015-01-13 MED ORDER — BACITRACIN-NEOMYCIN-POLYMYXIN OINTMENT TUBE
TOPICAL_OINTMENT | Freq: Every day | CUTANEOUS | Status: DC
  Administered 2015-01-13: 1 via TOPICAL
  Administered 2015-01-14: 10:00:00 via TOPICAL
  Filled 2015-01-13: qty 15

## 2015-01-13 NOTE — Progress Notes (Signed)
Orthopedic Tech Progress Note Patient Details:  Austin Hopkins 12/09/1960 [id number]  Ortho Devices Type of Ortho Device: Arm sling, Thumb velcro splint Ortho Device/Splint Location: rue Ortho Device/Splint Interventions: Application   Hildred Priest 01/13/2015, 9:46 AM

## 2015-01-13 NOTE — Progress Notes (Signed)
Patient examined at bedside this am  Still weak in biceps and deltoid function  Pain at base of right thumb with xray positive for bennetts fracture  Will splint and see in my office this week   Will need CRPP   Right wrist film suspicious for SL tear but completely nontender   Will get repeat and comparison views in my office this week   Needs repeat left hand xr to assess small pipj
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
## 2015-01-13 NOTE — Progress Notes (Signed)
Pt oxygen saturation in 80s. Pt advised earlier in shift to the need to wear oxygen. Pt refused. Will continue to monitor 02 saturation.

## 2015-01-13 NOTE — Progress Notes (Signed)
PT Cancellation Note  Patient Details Name: Austin Hopkins MRN: [id number] DOB: [birth year]   Cancelled Treatment:    Reason Eval/Treat Not Completed: Patient at procedure or test/unavailable Patient taken for x-ray. Will follow up as time allows for PT evaluation. Likely tomorrow (Monday AM).  Ellouise Newer 01/13/2015, 3:38 PM Camille Bal [place], [employer]
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
## 2015-01-14 ENCOUNTER — Inpatient Hospital Stay (HOSPITAL_COMMUNITY): Payer: 59

## 2015-01-14 DIAGNOSIS — S066X9A Traumatic subarachnoid hemorrhage with loss of consciousness of unspecified duration, initial encounter: Secondary | ICD-10-CM | POA: Diagnosis not present

## 2015-01-14 DIAGNOSIS — S63257A Unspecified dislocation of left little finger, initial encounter: Secondary | ICD-10-CM | POA: Diagnosis present

## 2015-01-14 DIAGNOSIS — S61412A Laceration without foreign body of left hand, initial encounter: Secondary | ICD-10-CM | POA: Diagnosis present

## 2015-01-14 DIAGNOSIS — S12500A Unspecified displaced fracture of sixth cervical vertebra, initial encounter for closed fracture: Secondary | ICD-10-CM | POA: Diagnosis present

## 2015-01-14 DIAGNOSIS — S62201A Unspecified fracture of first metacarpal bone, right hand, initial encounter for closed fracture: Secondary | ICD-10-CM | POA: Diagnosis present

## 2015-01-14 DIAGNOSIS — S129XXA Fracture of neck, unspecified, initial encounter: Secondary | ICD-10-CM | POA: Diagnosis present

## 2015-01-14 DIAGNOSIS — S12400A Unspecified displaced fracture of fifth cervical vertebra, initial encounter for closed fracture: Secondary | ICD-10-CM | POA: Diagnosis present

## 2015-01-14 DIAGNOSIS — D62 Acute posthemorrhagic anemia: Secondary | ICD-10-CM | POA: Diagnosis not present

## 2015-01-14 DIAGNOSIS — S066XAA Traumatic subarachnoid hemorrhage with loss of consciousness status unknown, initial encounter: Secondary | ICD-10-CM | POA: Diagnosis not present

## 2015-01-14 DIAGNOSIS — S143XXA Injury of brachial plexus, initial encounter: Secondary | ICD-10-CM | POA: Diagnosis present

## 2015-01-14 DIAGNOSIS — S0292XA Unspecified fracture of facial bones, initial encounter for closed fracture: Secondary | ICD-10-CM | POA: Diagnosis present

## 2015-01-14 MED ORDER — AMOXICILLIN 500 MG PO CAPS
500.0000 mg | ORAL_CAPSULE | Freq: Three times a day (TID) | ORAL | Status: DC
  Administered 2015-01-14 – 2015-01-15 (×3): 500 mg via ORAL
  Filled 2015-01-14 (×8): qty 1

## 2015-01-14 MED ORDER — DOCUSATE SODIUM 100 MG PO CAPS
100.0000 mg | ORAL_CAPSULE | Freq: Two times a day (BID) | ORAL | Status: DC
  Administered 2015-01-14 – 2015-01-15 (×3): 100 mg via ORAL
  Filled 2015-01-14 (×3): qty 1

## 2015-01-14 MED ORDER — HYDROMORPHONE HCL 1 MG/ML IJ SOLN: 0.5000 mg | INTRAMUSCULAR | Status: DC | PRN

## 2015-01-14 MED ORDER — POLYETHYLENE GLYCOL 3350 17 G PO PACK
17.0000 g | PACK | Freq: Every day | ORAL | Status: DC
  Filled 2015-01-14: qty 1

## 2015-01-14 MED ORDER — DIPHENHYDRAMINE HCL 25 MG PO CAPS
25.0000 mg | ORAL_CAPSULE | Freq: Three times a day (TID) | ORAL | Status: DC | PRN
  Administered 2015-01-14: 25 mg via ORAL
  Filled 2015-01-14: qty 1

## 2015-01-14 MED ORDER — HYDROCODONE-ACETAMINOPHEN 10-325 MG PO TABS
0.5000 | ORAL_TABLET | ORAL | Status: DC | PRN
Start: 2015-01-14 — End: 2015-01-15
  Administered 2015-01-14 (×2): 2 via ORAL
  Administered 2015-01-15: 1 via ORAL
  Administered 2015-01-15 (×2): 2 via ORAL
  Filled 2015-01-14 (×2): qty 2
  Filled 2015-01-14: qty 1
  Filled 2015-01-14 (×2): qty 2

## 2015-01-14 MED ORDER — TRAMADOL HCL 50 MG PO TABS
100.0000 mg | ORAL_TABLET | Freq: Four times a day (QID) | ORAL | Status: DC
  Administered 2015-01-14 – 2015-01-15 (×4): 100 mg via ORAL
  Filled 2015-01-14 (×4): qty 2

## 2015-01-14 MED ORDER — NAPROXEN 250 MG PO TABS
500.0000 mg | ORAL_TABLET | Freq: Two times a day (BID) | ORAL | Status: DC
  Administered 2015-01-14 – 2015-01-15 (×3): 500 mg via ORAL
  Filled 2015-01-14 (×3): qty 2
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
## 2015-01-14 NOTE — Progress Notes (Signed)
Subjective: Patient sitting up in chair, reasonably comfortable. Cervical collar open, and not secured around his neck.  Objective: Vital signs in last 24 hours: Filed Vitals:   01/13/15 2121 01/14/15 0140 01/14/15 0510 01/14/15 0934  BP: 141/65 150/86 156/72 154/78  Pulse: 73 68 73 72  Temp: 98.2 F (36.8 C) 98.2 F (36.8 C) 98.2 F (36.8 C) 98.5 F (36.9 C)  TempSrc: Oral Oral Oral Oral  Resp: 17 17 18 16   Height:      Weight:      SpO2: 94% 93% 96% 94%    Intake/Output from previous day: 03/06 0701 - 03/07 0700 In: 167.8 [I.V.:167.8] Out: 1350 [Urine:1350] Intake/Output this shift:    Physical Exam:  Awake alert, oriented. Following commands. Speech fluent. Right deltoid and biceps remain 0-1/5; right triceps, intrinsics, and grip 4 minus to 4/5.  CBC  Recent Labs  01/12/15 0514 01/13/15 0252  WBC 11.0* 7.0  HGB 13.9 11.9*  HCT 39.3 34.2*  PLT 182 141*   BMET  Recent Labs  01/12/15 0514 01/13/15 0252  NA 133* 135  K 4.0 3.4*  CL 99 102  CO2 22 26  GLUCOSE 175* 123*  BUN 9 8  CREATININE 0.89 0.80  CALCIUM 8.8 8.4    Assessment/Plan: We'll check AP and lateral standing upright cervical spine x-rays, to check alignment. May be able to transition from hard collar to soft collar in 6-8 weeks.  Upper extremity injuries, including right upper brachial plexopathy being managed by Dr. Burney Gauze.   Hosie Spangle, MD 01/14/2015, 10:51 AM
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
## 2015-01-15 LAB — GLUCOSE, CAPILLARY: GLUCOSE-CAPILLARY: 118 mg/dL — AB (ref 70–99)

## 2015-01-15 MED ORDER — CIPROFLOXACIN-DEXAMETHASONE 0.3-0.1 % OT SUSP: 4.0000 [drp] | Freq: Two times a day (BID) | OTIC | Status: DC

## 2015-01-15 MED ORDER — METHOCARBAMOL 500 MG PO TABS: 500.0000 mg | ORAL_TABLET | Freq: Three times a day (TID) | ORAL | Status: DC | PRN

## 2015-01-15 MED ORDER — TRAMADOL HCL 50 MG PO TABS: 100.0000 mg | ORAL_TABLET | Freq: Four times a day (QID) | ORAL | Status: DC

## 2015-01-15 MED ORDER — NAPROXEN 500 MG PO TABS: 500.0000 mg | ORAL_TABLET | Freq: Two times a day (BID) | ORAL | Status: DC

## 2015-01-15 MED ORDER — AMOXICILLIN 500 MG PO CAPS: 500.0000 mg | ORAL_CAPSULE | Freq: Three times a day (TID) | ORAL | Status: DC

## 2015-01-15 MED ORDER — HYDROCODONE-ACETAMINOPHEN 10-325 MG PO TABS: 1.0000 | ORAL_TABLET | ORAL | Status: DC | PRN

## 2015-01-15 NOTE — Progress Notes (Signed)
D/c to home with wife.  [facility] delivered equipment to patient and wife and d/c instructions given and demonstrated understanding.  VSS. Breathing regular and unlabored on room air. Pain med given for d/c
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
## 2015-01-15 NOTE — Discharge Instructions (Signed)
No driving while taking hydrocodone.  Wear cervical collar when out of bed.  Have assistance when out of bed.
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
## 2015-01-15 NOTE — Progress Notes (Addendum)
Pt planned for d/c home today.  [facility]ose [facility] for HHPT/OT.  Address and preferred phone number listed in EPIC are correct. Pt gave [phone] as an alternate number.  DME ordered through [facility]: transport wheelchair, rolling walker and 4-point cane.  Pt plans to pick up a raised toilet seat with handles on his own.  Sandi Mariscal, RN BSN MHA CCM  Case Manager, Trauma Service/Unit 59M [phone]
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
## 2015-01-15 NOTE — Progress Notes (Signed)
Subjective: Patient resting in bed, not wearing cervical collar (it is on the bedside table), denies complaints. Upright AP and lateral cervical spine x-rays yesterday show good alignment. Patient instructed to wear Aspen cervical collar, until instructed not to do so.  Objective: Vital signs in last 24 hours: Filed Vitals:   01/14/15 0510 01/14/15 0934 01/14/15 2026 01/15/15 0455  BP: 156/72 154/78 141/73 141/59  Pulse: 73 72 65 64  Temp: 98.2 F (36.8 C) 98.5 F (36.9 C) 98.4 F (36.9 C) 98 F (36.7 C)  TempSrc: Oral Oral Oral Oral  Resp: 18 16 18 18   Height:      Weight:      SpO2: 96% 94% 95% 96%    Intake/Output from previous day: 03/07 0701 - 03/08 0700 In: 470 [P.O.:470] Out: -  Intake/Output this shift: Total I/O In: -  Out: 375 [Urine:375]  Physical Exam:  Awake alert, oriented. Following commands. Speech fluent. No change in motor function in right upper extremity.  CBC  Recent Labs  01/13/15 0252  WBC 7.0  HGB 11.9*  HCT 34.2*  PLT 141*   BMET  Recent Labs  01/13/15 0252  NA 135  K 3.4*  CL 102  CO2 26  GLUCOSE 123*  BUN 8  CREATININE 0.80  CALCIUM 8.4    Studies/Results: Dg Chest 1 View  01/13/2015   CLINICAL DATA:  Followup of right-sided pneumothorax.  EXAM: CHEST  1 VIEW  COMPARISON:  01/11/2017  FINDINGS: Breathing apparatus projecting over the lung apices. Right rib fractures. Patient rotated right. Normal heart size. Trace right pleural fluid or thickening. Subcutaneous emphysema about the right chest wall is similar. No apical pneumothorax identified, given limitation of artifact from support apparatus. Cannot exclude trace lateral right pleural air. No lobar consolidation. Minimal volume loss at the right lung base.  IMPRESSION: Right rib fractures with pleural thickening and subcutaneous emphysema. Cannot exclude trace lateral right pleural air. No large apical component identified.  Degradation secondary to breathing apparatus artifact  projecting over the lung apices.   Electronically Signed   By: Abigail Miyamoto M.D.   On: 01/13/2015 17:45   Dg Cervical Spine 2 Or 3 Views  01/14/2015   CLINICAL DATA:  Cervical spine fractures  EXAM: CERVICAL SPINE - 2-3 VIEW  COMPARISON:  CT cervical spine 01/11/2015  FINDINGS: Artifacts from collar.  Mild thickening of prevertebral soft tissues from C4-C7.  Vertebral body heights maintained without subluxation.  The numerous cervical spine fractures identified on the prior CT are inadequately visualized on the current radiographs.  Subtle visualization of the RIGHT transverse process fracture at C5.  Subtle visualization of the extension of the LEFT C6 fracture into the LEFT articular process of C6 is noted.  Remaining fractures are not clearly delineated.  Osseous mineralization normal.  IMPRESSION: Cervical spine alignments remain normal though the numerous fractures identified by prior CT are inadequately visualized radiographically as above.  If assessment of interval change is required, recommend CT.   Electronically Signed   By: Lavonia Dana M.D.   On: 01/14/2015 12:36   Dg Hand 2 View Left  01/13/2015   CLINICAL DATA:  Followup of fracture reduction.  EXAM: LEFT HAND - 2 VIEW  COMPARISON:  01/11/2015  FINDINGS: Interval relocation of the proximal interphalangeal joint of the fifth digit. No fracture identified. Lateral view degraded by positioning overlap of fingers. No oblique images performed.  IMPRESSION: Interval relocation of the proximal interphalangeal joint of the fifth digit. Degraded lateral view.  Electronically Signed   By: Abigail Miyamoto M.D.   On: 01/13/2015 17:43    Assessment/Plan: Stable from a neurosurgical perspective. Will need to follow-up in my office regarding the cervical spine fracture in about a month, with AP and lateral cervical spine x-rays on the day the point at my office. He has been instructed to wear the Aspen cervical collar 24/7 until I instructed otherwise.  I've  discussed the case with Silvestre Gunner, PA, with trauma surgical service. He anticipates probable discharge today. Patient plans on following up with Dr. Burney Gauze at his office later today regarding his hands and upper extremity injuries including the brachial plexopathy.   Hosie Spangle, MD 01/15/2015, 8:27 AM
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
## 2015-01-16 ENCOUNTER — Telehealth (HOSPITAL_COMMUNITY): Payer: Self-pay

## 2015-01-16 MED ORDER — ONDANSETRON HCL 4 MG PO TABS: 4.0000 mg | ORAL_TABLET | ORAL | Status: DC | PRN

## 2015-01-16 NOTE — Telephone Encounter (Signed)
Wife called requesting Zofran. Will give rx.

## 2015-01-18 ENCOUNTER — Telehealth (HOSPITAL_COMMUNITY): Payer: Self-pay

## 2015-01-18 NOTE — Telephone Encounter (Signed)
D/w HH recommendation for continual use of cervical collar and fact that pt is non-compliant with that.

## 2015-02-14 ENCOUNTER — Other Ambulatory Visit: Payer: Self-pay | Admitting: Otolaryngology

## 2015-02-14 DIAGNOSIS — H61301 Acquired stenosis of right external ear canal, unspecified: Secondary | ICD-10-CM

## 2015-02-18 ENCOUNTER — Ambulatory Visit
Admission: RE | Admit: 2015-02-18 | Discharge: 2015-02-18 | Disposition: A | Discharge status: Home / Self Care | Payer: 59 | Source: Ambulatory Visit | Attending: Otolaryngology | Admitting: Otolaryngology

## 2015-02-18 DIAGNOSIS — H61301 Acquired stenosis of right external ear canal, unspecified: Secondary | ICD-10-CM

## 2015-03-21 ENCOUNTER — Other Ambulatory Visit: Payer: Self-pay | Admitting: Otolaryngology

## 2016-03-05 IMAGING — CR DG CERVICAL SPINE 2 OR 3 VIEWS
2 series · 2 of 2 positions shown · non-contrast
Comparison: CT cervical spine 01/11/2015

CLINICAL DATA: Cervical spine fractures

EXAM:
CERVICAL SPINE - 2-3 VIEW

[c-spine lat]
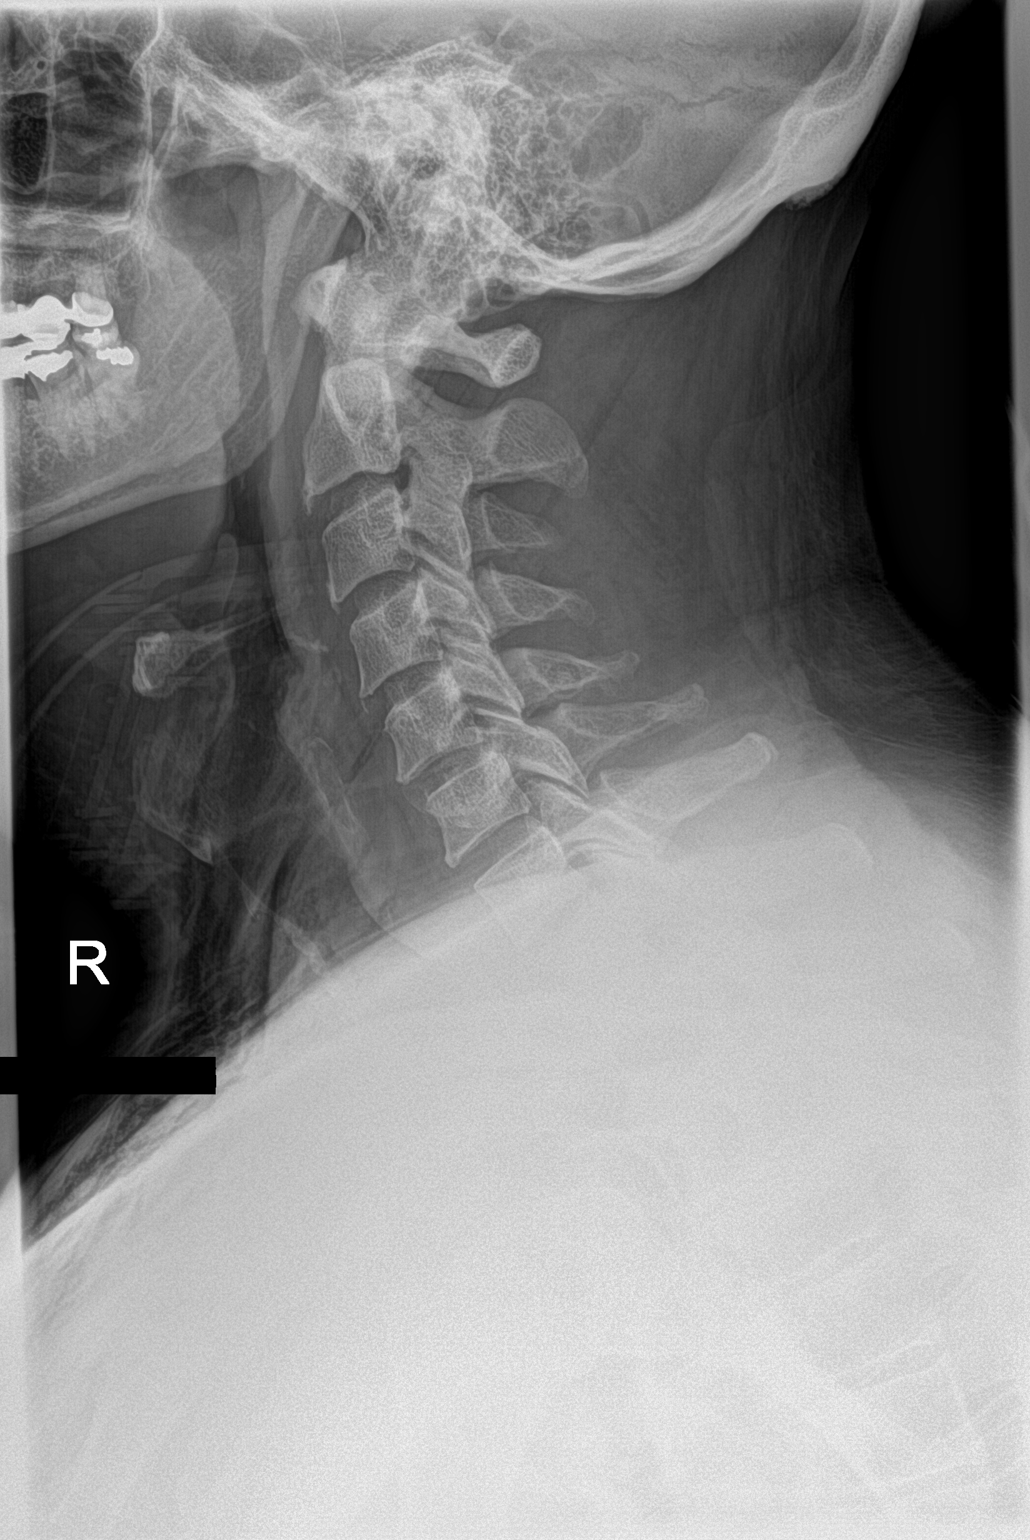

[c-spine ap]
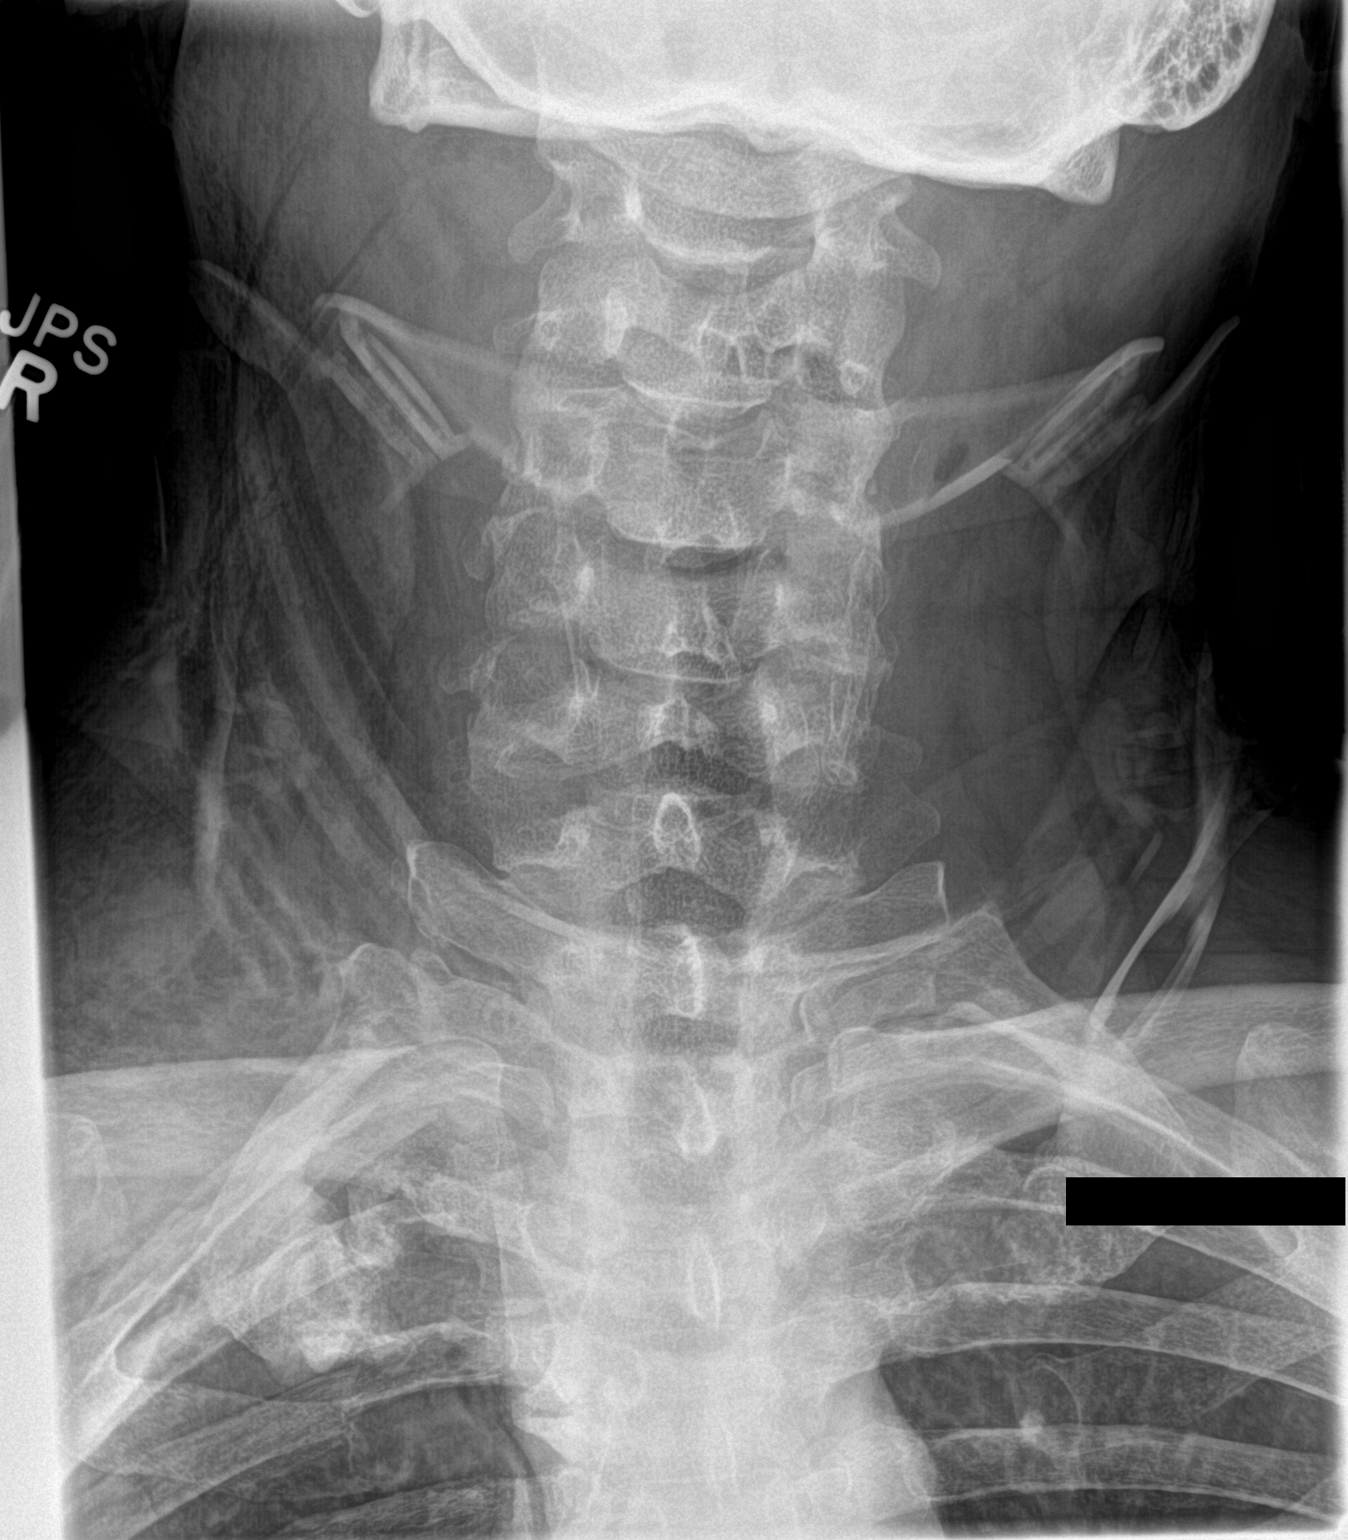

[2 of 2 positions shown; findings below may reference images not displayed]

FINDINGS: Artifacts from collar.

Mild thickening of prevertebral soft tissues from C4-C7.

Vertebral body heights maintained without subluxation.

The numerous cervical spine fractures identified on the prior CT are
inadequately visualized on the current radiographs.

Subtle visualization of the RIGHT transverse process fracture at C5.

Subtle visualization of the extension of the LEFT C6 fracture into
the LEFT articular process of C6 is noted.

Remaining fractures are not clearly delineated.

Osseous mineralization normal.
IMPRESSION: Cervical spine alignments remain normal though the numerous
fractures identified by prior CT are inadequately visualized
radiographically as above.

If assessment of interval change is required, recommend CT.

## 2017-07-15 DIAGNOSIS — S143XXA Injury of brachial plexus, initial encounter: Secondary | ICD-10-CM | POA: Diagnosis not present

## 2017-07-15 DIAGNOSIS — G5601 Carpal tunnel syndrome, right upper limb: Secondary | ICD-10-CM | POA: Diagnosis not present

## 2017-07-15 DIAGNOSIS — S143XXD Injury of brachial plexus, subsequent encounter: Secondary | ICD-10-CM | POA: Diagnosis not present

## 2017-07-26 DIAGNOSIS — I10 Essential (primary) hypertension: Secondary | ICD-10-CM | POA: Diagnosis not present

## 2017-07-26 DIAGNOSIS — R7309 Other abnormal glucose: Secondary | ICD-10-CM | POA: Diagnosis not present

## 2017-07-26 DIAGNOSIS — Z125 Encounter for screening for malignant neoplasm of prostate: Secondary | ICD-10-CM | POA: Diagnosis not present

## 2017-08-02 DIAGNOSIS — G4700 Insomnia, unspecified: Secondary | ICD-10-CM | POA: Diagnosis not present

## 2017-08-02 DIAGNOSIS — G894 Chronic pain syndrome: Secondary | ICD-10-CM | POA: Diagnosis not present

## 2017-08-02 DIAGNOSIS — H61301 Acquired stenosis of right external ear canal, unspecified: Secondary | ICD-10-CM | POA: Diagnosis not present

## 2017-08-02 DIAGNOSIS — C61 Malignant neoplasm of prostate: Secondary | ICD-10-CM | POA: Diagnosis not present

## 2017-08-02 DIAGNOSIS — J309 Allergic rhinitis, unspecified: Secondary | ICD-10-CM | POA: Diagnosis not present

## 2017-08-02 DIAGNOSIS — G540 Brachial plexus disorders: Secondary | ICD-10-CM | POA: Diagnosis not present

## 2017-08-02 DIAGNOSIS — Z6837 Body mass index (BMI) 37.0-37.9, adult: Secondary | ICD-10-CM | POA: Diagnosis not present

## 2017-08-02 DIAGNOSIS — I10 Essential (primary) hypertension: Secondary | ICD-10-CM | POA: Diagnosis not present

## 2017-08-02 DIAGNOSIS — E669 Obesity, unspecified: Secondary | ICD-10-CM | POA: Diagnosis not present

## 2017-08-02 DIAGNOSIS — Z0000 Encounter for general adult medical examination without abnormal findings: Secondary | ICD-10-CM | POA: Diagnosis not present

## 2017-09-14 DIAGNOSIS — Z6836 Body mass index (BMI) 36.0-36.9, adult: Secondary | ICD-10-CM | POA: Diagnosis not present

## 2017-09-14 DIAGNOSIS — I10 Essential (primary) hypertension: Secondary | ICD-10-CM | POA: Diagnosis not present

## 2017-09-14 DIAGNOSIS — G894 Chronic pain syndrome: Secondary | ICD-10-CM | POA: Diagnosis not present

## 2018-01-13 DIAGNOSIS — S143XXD Injury of brachial plexus, subsequent encounter: Secondary | ICD-10-CM | POA: Diagnosis not present

## 2018-01-13 DIAGNOSIS — S143XXA Injury of brachial plexus, initial encounter: Secondary | ICD-10-CM | POA: Diagnosis not present

## 2018-01-13 DIAGNOSIS — G5601 Carpal tunnel syndrome, right upper limb: Secondary | ICD-10-CM | POA: Diagnosis not present

## 2018-02-04 DIAGNOSIS — G894 Chronic pain syndrome: Secondary | ICD-10-CM | POA: Diagnosis not present

## 2018-02-04 DIAGNOSIS — C61 Malignant neoplasm of prostate: Secondary | ICD-10-CM | POA: Diagnosis not present

## 2018-02-04 DIAGNOSIS — G4709 Other insomnia: Secondary | ICD-10-CM | POA: Diagnosis not present

## 2018-02-04 DIAGNOSIS — E7849 Other hyperlipidemia: Secondary | ICD-10-CM | POA: Diagnosis not present

## 2018-02-04 DIAGNOSIS — R945 Abnormal results of liver function studies: Secondary | ICD-10-CM | POA: Diagnosis not present

## 2018-02-04 DIAGNOSIS — R69 Illness, unspecified: Secondary | ICD-10-CM | POA: Diagnosis not present

## 2018-02-04 DIAGNOSIS — E668 Other obesity: Secondary | ICD-10-CM | POA: Diagnosis not present

## 2018-02-04 DIAGNOSIS — M1990 Unspecified osteoarthritis, unspecified site: Secondary | ICD-10-CM | POA: Diagnosis not present

## 2018-02-04 DIAGNOSIS — G540 Brachial plexus disorders: Secondary | ICD-10-CM | POA: Diagnosis not present

## 2018-02-04 DIAGNOSIS — I10 Essential (primary) hypertension: Secondary | ICD-10-CM | POA: Diagnosis not present

## 2018-02-16 DIAGNOSIS — M25512 Pain in left shoulder: Secondary | ICD-10-CM | POA: Diagnosis not present

## 2018-02-17 DIAGNOSIS — G894 Chronic pain syndrome: Secondary | ICD-10-CM | POA: Diagnosis not present

## 2018-02-17 DIAGNOSIS — R252 Cramp and spasm: Secondary | ICD-10-CM | POA: Diagnosis not present

## 2018-02-17 DIAGNOSIS — Z6831 Body mass index (BMI) 31.0-31.9, adult: Secondary | ICD-10-CM | POA: Diagnosis not present

## 2018-02-17 DIAGNOSIS — I10 Essential (primary) hypertension: Secondary | ICD-10-CM | POA: Diagnosis not present

## 2018-02-17 DIAGNOSIS — I8390 Asymptomatic varicose veins of unspecified lower extremity: Secondary | ICD-10-CM | POA: Diagnosis not present

## 2018-03-08 DIAGNOSIS — R252 Cramp and spasm: Secondary | ICD-10-CM | POA: Diagnosis not present

## 2018-03-08 DIAGNOSIS — I10 Essential (primary) hypertension: Secondary | ICD-10-CM | POA: Diagnosis not present

## 2018-03-08 DIAGNOSIS — Z6835 Body mass index (BMI) 35.0-35.9, adult: Secondary | ICD-10-CM | POA: Diagnosis not present

## 2018-03-08 DIAGNOSIS — G894 Chronic pain syndrome: Secondary | ICD-10-CM | POA: Diagnosis not present

## 2018-04-07 DIAGNOSIS — M25551 Pain in right hip: Secondary | ICD-10-CM | POA: Diagnosis not present

## 2018-04-07 DIAGNOSIS — M1611 Unilateral primary osteoarthritis, right hip: Secondary | ICD-10-CM | POA: Diagnosis not present

## 2018-04-13 DIAGNOSIS — M1611 Unilateral primary osteoarthritis, right hip: Secondary | ICD-10-CM | POA: Diagnosis not present

## 2018-05-19 DIAGNOSIS — M1611 Unilateral primary osteoarthritis, right hip: Secondary | ICD-10-CM | POA: Diagnosis not present

## 2018-08-02 DIAGNOSIS — R82998 Other abnormal findings in urine: Secondary | ICD-10-CM | POA: Diagnosis not present

## 2018-08-02 DIAGNOSIS — R7309 Other abnormal glucose: Secondary | ICD-10-CM | POA: Diagnosis not present

## 2018-08-02 DIAGNOSIS — I10 Essential (primary) hypertension: Secondary | ICD-10-CM | POA: Diagnosis not present

## 2018-08-02 DIAGNOSIS — E7849 Other hyperlipidemia: Secondary | ICD-10-CM | POA: Diagnosis not present

## 2018-08-02 DIAGNOSIS — Z125 Encounter for screening for malignant neoplasm of prostate: Secondary | ICD-10-CM | POA: Diagnosis not present

## 2018-08-11 DIAGNOSIS — I10 Essential (primary) hypertension: Secondary | ICD-10-CM | POA: Diagnosis not present

## 2018-08-11 DIAGNOSIS — R69 Illness, unspecified: Secondary | ICD-10-CM | POA: Diagnosis not present

## 2018-08-11 DIAGNOSIS — H61301 Acquired stenosis of right external ear canal, unspecified: Secondary | ICD-10-CM | POA: Diagnosis not present

## 2018-08-11 DIAGNOSIS — C61 Malignant neoplasm of prostate: Secondary | ICD-10-CM | POA: Diagnosis not present

## 2018-08-11 DIAGNOSIS — G894 Chronic pain syndrome: Secondary | ICD-10-CM | POA: Diagnosis not present

## 2018-08-11 DIAGNOSIS — Z23 Encounter for immunization: Secondary | ICD-10-CM | POA: Diagnosis not present

## 2018-08-11 DIAGNOSIS — Z0000 Encounter for general adult medical examination without abnormal findings: Secondary | ICD-10-CM | POA: Diagnosis not present

## 2018-08-11 DIAGNOSIS — M1990 Unspecified osteoarthritis, unspecified site: Secondary | ICD-10-CM | POA: Diagnosis not present

## 2018-08-11 DIAGNOSIS — R252 Cramp and spasm: Secondary | ICD-10-CM | POA: Diagnosis not present

## 2018-08-11 DIAGNOSIS — I8390 Asymptomatic varicose veins of unspecified lower extremity: Secondary | ICD-10-CM | POA: Diagnosis not present

## 2018-08-16 DIAGNOSIS — G5601 Carpal tunnel syndrome, right upper limb: Secondary | ICD-10-CM | POA: Diagnosis not present

## 2018-08-16 DIAGNOSIS — S143XXA Injury of brachial plexus, initial encounter: Secondary | ICD-10-CM | POA: Diagnosis not present

## 2018-08-16 DIAGNOSIS — S143XXD Injury of brachial plexus, subsequent encounter: Secondary | ICD-10-CM | POA: Diagnosis not present

## 2018-12-01 DIAGNOSIS — M25512 Pain in left shoulder: Secondary | ICD-10-CM | POA: Diagnosis not present

## 2018-12-02 DIAGNOSIS — M25551 Pain in right hip: Secondary | ICD-10-CM | POA: Diagnosis not present

## 2018-12-02 DIAGNOSIS — M1611 Unilateral primary osteoarthritis, right hip: Secondary | ICD-10-CM | POA: Diagnosis not present

## 2018-12-15 DIAGNOSIS — M25512 Pain in left shoulder: Secondary | ICD-10-CM | POA: Diagnosis not present

## 2019-05-30 DIAGNOSIS — M25511 Pain in right shoulder: Secondary | ICD-10-CM | POA: Diagnosis not present

## 2019-05-30 DIAGNOSIS — M25512 Pain in left shoulder: Secondary | ICD-10-CM | POA: Diagnosis not present

## 2019-06-07 DIAGNOSIS — M1611 Unilateral primary osteoarthritis, right hip: Secondary | ICD-10-CM | POA: Diagnosis not present

## 2019-07-07 DIAGNOSIS — M1611 Unilateral primary osteoarthritis, right hip: Secondary | ICD-10-CM | POA: Diagnosis not present

## 2019-08-08 DIAGNOSIS — I10 Essential (primary) hypertension: Secondary | ICD-10-CM | POA: Diagnosis not present

## 2019-08-08 DIAGNOSIS — R739 Hyperglycemia, unspecified: Secondary | ICD-10-CM | POA: Diagnosis not present

## 2019-08-08 DIAGNOSIS — Z125 Encounter for screening for malignant neoplasm of prostate: Secondary | ICD-10-CM | POA: Diagnosis not present

## 2019-08-15 DIAGNOSIS — E669 Obesity, unspecified: Secondary | ICD-10-CM | POA: Diagnosis not present

## 2019-08-15 DIAGNOSIS — I10 Essential (primary) hypertension: Secondary | ICD-10-CM | POA: Diagnosis not present

## 2019-08-15 DIAGNOSIS — I8390 Asymptomatic varicose veins of unspecified lower extremity: Secondary | ICD-10-CM | POA: Diagnosis not present

## 2019-08-15 DIAGNOSIS — D649 Anemia, unspecified: Secondary | ICD-10-CM | POA: Diagnosis not present

## 2019-08-15 DIAGNOSIS — E785 Hyperlipidemia, unspecified: Secondary | ICD-10-CM | POA: Diagnosis not present

## 2019-08-15 DIAGNOSIS — Z0000 Encounter for general adult medical examination without abnormal findings: Secondary | ICD-10-CM | POA: Diagnosis not present

## 2019-08-15 DIAGNOSIS — R69 Illness, unspecified: Secondary | ICD-10-CM | POA: Diagnosis not present

## 2019-08-15 DIAGNOSIS — R252 Cramp and spasm: Secondary | ICD-10-CM | POA: Diagnosis not present

## 2019-08-15 DIAGNOSIS — E871 Hypo-osmolality and hyponatremia: Secondary | ICD-10-CM | POA: Diagnosis not present

## 2019-08-15 DIAGNOSIS — R945 Abnormal results of liver function studies: Secondary | ICD-10-CM | POA: Diagnosis not present

## 2019-08-18 DIAGNOSIS — Z23 Encounter for immunization: Secondary | ICD-10-CM | POA: Diagnosis not present

## 2019-09-07 DIAGNOSIS — S143XXD Injury of brachial plexus, subsequent encounter: Secondary | ICD-10-CM | POA: Diagnosis not present

## 2019-10-26 DIAGNOSIS — I10 Essential (primary) hypertension: Secondary | ICD-10-CM | POA: Diagnosis not present

## 2019-10-26 DIAGNOSIS — R82998 Other abnormal findings in urine: Secondary | ICD-10-CM | POA: Diagnosis not present

## 2019-10-26 DIAGNOSIS — E7849 Other hyperlipidemia: Secondary | ICD-10-CM | POA: Diagnosis not present

## 2019-10-26 DIAGNOSIS — D649 Anemia, unspecified: Secondary | ICD-10-CM | POA: Diagnosis not present

## 2019-11-14 DIAGNOSIS — H5203 Hypermetropia, bilateral: Secondary | ICD-10-CM | POA: Diagnosis not present

## 2019-11-14 DIAGNOSIS — H524 Presbyopia: Secondary | ICD-10-CM | POA: Diagnosis not present

## 2019-11-14 DIAGNOSIS — H52223 Regular astigmatism, bilateral: Secondary | ICD-10-CM | POA: Diagnosis not present

## 2019-11-23 DIAGNOSIS — M25562 Pain in left knee: Secondary | ICD-10-CM | POA: Diagnosis not present

## 2019-11-23 DIAGNOSIS — M7061 Trochanteric bursitis, right hip: Secondary | ICD-10-CM | POA: Diagnosis not present

## 2019-11-23 DIAGNOSIS — M1712 Unilateral primary osteoarthritis, left knee: Secondary | ICD-10-CM | POA: Diagnosis not present

## 2019-11-23 DIAGNOSIS — S8002XD Contusion of left knee, subsequent encounter: Secondary | ICD-10-CM | POA: Diagnosis not present

## 2019-11-23 DIAGNOSIS — M1611 Unilateral primary osteoarthritis, right hip: Secondary | ICD-10-CM | POA: Diagnosis not present

## 2020-02-20 DIAGNOSIS — E669 Obesity, unspecified: Secondary | ICD-10-CM | POA: Diagnosis not present

## 2020-02-20 DIAGNOSIS — C61 Malignant neoplasm of prostate: Secondary | ICD-10-CM | POA: Diagnosis not present

## 2020-02-20 DIAGNOSIS — M1990 Unspecified osteoarthritis, unspecified site: Secondary | ICD-10-CM | POA: Diagnosis not present

## 2020-02-20 DIAGNOSIS — R69 Illness, unspecified: Secondary | ICD-10-CM | POA: Diagnosis not present

## 2020-02-20 DIAGNOSIS — E871 Hypo-osmolality and hyponatremia: Secondary | ICD-10-CM | POA: Diagnosis not present

## 2020-02-20 DIAGNOSIS — D649 Anemia, unspecified: Secondary | ICD-10-CM | POA: Diagnosis not present

## 2020-02-20 DIAGNOSIS — I10 Essential (primary) hypertension: Secondary | ICD-10-CM | POA: Diagnosis not present

## 2020-03-06 DIAGNOSIS — Z23 Encounter for immunization: Secondary | ICD-10-CM | POA: Diagnosis not present

## 2020-03-07 DIAGNOSIS — S143XXA Injury of brachial plexus, initial encounter: Secondary | ICD-10-CM | POA: Diagnosis not present

## 2020-03-07 DIAGNOSIS — G5601 Carpal tunnel syndrome, right upper limb: Secondary | ICD-10-CM | POA: Diagnosis not present

## 2020-03-07 DIAGNOSIS — S143XXD Injury of brachial plexus, subsequent encounter: Secondary | ICD-10-CM | POA: Diagnosis not present

## 2020-03-12 DIAGNOSIS — G8929 Other chronic pain: Secondary | ICD-10-CM | POA: Diagnosis not present

## 2020-03-12 DIAGNOSIS — M545 Low back pain: Secondary | ICD-10-CM | POA: Diagnosis not present

## 2020-03-27 DIAGNOSIS — M1611 Unilateral primary osteoarthritis, right hip: Secondary | ICD-10-CM | POA: Diagnosis not present

## 2020-05-15 ENCOUNTER — Encounter: Payer: 59 | Admitting: Vascular Surgery

## 2020-05-15 ENCOUNTER — Encounter (HOSPITAL_COMMUNITY): Payer: 59

## 2020-07-01 ENCOUNTER — Other Ambulatory Visit: Payer: Self-pay

## 2020-07-01 DIAGNOSIS — I8393 Asymptomatic varicose veins of bilateral lower extremities: Secondary | ICD-10-CM

## 2020-07-03 DIAGNOSIS — M1611 Unilateral primary osteoarthritis, right hip: Secondary | ICD-10-CM | POA: Diagnosis not present

## 2020-07-10 ENCOUNTER — Encounter: Payer: 59 | Admitting: Vascular Surgery

## 2020-07-10 ENCOUNTER — Encounter (HOSPITAL_COMMUNITY): Payer: 59

## 2020-07-16 ENCOUNTER — Encounter (HOSPITAL_COMMUNITY): Payer: Medicare HMO

## 2020-07-19 DIAGNOSIS — M7061 Trochanteric bursitis, right hip: Secondary | ICD-10-CM | POA: Diagnosis not present

## 2020-07-19 DIAGNOSIS — M1712 Unilateral primary osteoarthritis, left knee: Secondary | ICD-10-CM | POA: Diagnosis not present

## 2020-07-19 DIAGNOSIS — M1611 Unilateral primary osteoarthritis, right hip: Secondary | ICD-10-CM | POA: Diagnosis not present

## 2020-08-06 DIAGNOSIS — R6883 Chills (without fever): Secondary | ICD-10-CM | POA: Diagnosis not present

## 2020-08-06 DIAGNOSIS — G894 Chronic pain syndrome: Secondary | ICD-10-CM | POA: Diagnosis not present

## 2020-08-06 DIAGNOSIS — R634 Abnormal weight loss: Secondary | ICD-10-CM | POA: Diagnosis not present

## 2020-08-06 DIAGNOSIS — Z1152 Encounter for screening for COVID-19: Secondary | ICD-10-CM | POA: Diagnosis not present

## 2020-08-06 DIAGNOSIS — R110 Nausea: Secondary | ICD-10-CM | POA: Diagnosis not present

## 2020-08-06 DIAGNOSIS — R140 Abdominal distension (gaseous): Secondary | ICD-10-CM | POA: Diagnosis not present

## 2020-08-06 DIAGNOSIS — I10 Essential (primary) hypertension: Secondary | ICD-10-CM | POA: Diagnosis not present

## 2020-08-08 ENCOUNTER — Other Ambulatory Visit: Payer: Self-pay | Admitting: Family Medicine

## 2020-08-08 DIAGNOSIS — R140 Abdominal distension (gaseous): Secondary | ICD-10-CM

## 2020-08-08 DIAGNOSIS — R110 Nausea: Secondary | ICD-10-CM

## 2020-08-08 DIAGNOSIS — R634 Abnormal weight loss: Secondary | ICD-10-CM

## 2020-08-12 ENCOUNTER — Other Ambulatory Visit: Payer: Self-pay

## 2020-08-12 ENCOUNTER — Other Ambulatory Visit: Payer: Medicare HMO

## 2020-08-12 ENCOUNTER — Ambulatory Visit
Admission: RE | Admit: 2020-08-12 | Discharge: 2020-08-12 | Disposition: A | Discharge status: Home / Self Care | Payer: Medicare HMO | Source: Ambulatory Visit | Attending: Family Medicine | Admitting: Family Medicine

## 2020-08-12 DIAGNOSIS — R634 Abnormal weight loss: Secondary | ICD-10-CM

## 2020-08-12 DIAGNOSIS — R140 Abdominal distension (gaseous): Secondary | ICD-10-CM

## 2020-08-12 DIAGNOSIS — R110 Nausea: Secondary | ICD-10-CM | POA: Diagnosis not present

## 2020-08-12 DIAGNOSIS — Z8546 Personal history of malignant neoplasm of prostate: Secondary | ICD-10-CM | POA: Diagnosis not present

## 2020-08-15 DIAGNOSIS — I10 Essential (primary) hypertension: Secondary | ICD-10-CM | POA: Diagnosis not present

## 2020-08-15 DIAGNOSIS — G894 Chronic pain syndrome: Secondary | ICD-10-CM | POA: Diagnosis not present

## 2020-08-15 DIAGNOSIS — R69 Illness, unspecified: Secondary | ICD-10-CM | POA: Diagnosis not present

## 2020-08-15 DIAGNOSIS — M1990 Unspecified osteoarthritis, unspecified site: Secondary | ICD-10-CM | POA: Diagnosis not present

## 2020-08-15 DIAGNOSIS — E785 Hyperlipidemia, unspecified: Secondary | ICD-10-CM | POA: Diagnosis not present

## 2020-08-15 DIAGNOSIS — G540 Brachial plexus disorders: Secondary | ICD-10-CM | POA: Diagnosis not present

## 2020-08-15 DIAGNOSIS — R739 Hyperglycemia, unspecified: Secondary | ICD-10-CM | POA: Diagnosis not present

## 2020-08-15 DIAGNOSIS — R945 Abnormal results of liver function studies: Secondary | ICD-10-CM | POA: Diagnosis not present

## 2020-08-15 DIAGNOSIS — Z0000 Encounter for general adult medical examination without abnormal findings: Secondary | ICD-10-CM | POA: Diagnosis not present

## 2020-08-21 ENCOUNTER — Ambulatory Visit: Payer: Medicare HMO | Admitting: Orthopaedic Surgery

## 2020-08-21 ENCOUNTER — Ambulatory Visit (INDEPENDENT_AMBULATORY_CARE_PROVIDER_SITE_OTHER): Payer: Medicare HMO

## 2020-08-21 VITALS — Ht 70.5 in

## 2020-08-21 DIAGNOSIS — M1611 Unilateral primary osteoarthritis, right hip: Secondary | ICD-10-CM

## 2020-08-21 DIAGNOSIS — M25559 Pain in unspecified hip: Secondary | ICD-10-CM

## 2020-08-21 DIAGNOSIS — M1612 Unilateral primary osteoarthritis, left hip: Secondary | ICD-10-CM | POA: Diagnosis not present
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
## 2020-08-22 ENCOUNTER — Other Ambulatory Visit: Payer: Medicare HMO

## 2020-09-24 ENCOUNTER — Other Ambulatory Visit: Payer: Self-pay

## 2020-10-11 ENCOUNTER — Other Ambulatory Visit: Payer: Self-pay | Admitting: Physician Assistant

## 2020-10-14 NOTE — Patient Instructions (Addendum)
DUE TO COVID-19 ONLY ONE VISITOR IS ALLOWED TO COME WITH YOU AND STAY IN THE WAITING ROOM ONLY DURING PRE OP AND PROCEDURE DAY OF SURGERY. THE 1 VISITOR  MAY VISIT WITH YOU AFTER SURGERY IN YOUR PRIVATE ROOM DURING VISITING HOURS ONLY!  YOU NEED TO HAVE A COVID 19 TEST ON__12/7_____ @_1 :35______, THIS TEST MUST BE DONE BEFORE SURGERY,  COVID TESTING SITE [place] [address], IT IS ON THE RIGHT GOING OUT [address] APPROXIMATELY  2 MINUTES PAST [employer] ON THE RIGHT. ONCE YOUR COVID TEST IS COMPLETED,  PLEASE BEGIN THE QUARANTINE INSTRUCTIONS AS OUTLINED IN YOUR HANDOUT.                TYLEK BONEY    Your procedure is scheduled on: 10/18/20   Report to [facility] Main  Entrance   Report to admitting at  7:15 AM     Call this number if you have problems the morning of surgery [place], NO CHEWING GUM [place].   No food after midnight.    You may have clear liquid until 6:30 AM.    At 6:00 AM drink pre surgery drink.   Nothing by mouth after 6:30 AM.    Take these medicines the morning of surgery with A SIP OF WATER: None                                 You may not have any metal on your body including              piercings  Do not wear jewelry,  lotions, powders or deodorant              Men may shave face and neck.   Do not bring valuables to the hospital. [place].  Contacts, dentures or bridgework may not be worn into surgery.        Special Instructions: N/A              Please read over the following fact sheets you were given: _____________________________________________________________________             [facility] - Preparing for Surgery Before surgery, you can play an important role.   Because skin is not sterile, your skin needs to be as free of germs as possible.   You can  reduce the number of germs on your skin by washing with CHG (chlorahexidine gluconate) soap before surgery .  CHG is an antiseptic cleaner which kills germs and bonds with the skin to continue killing germs even after washing. Please DO NOT use if you have an allergy to CHG or antibacterial soaps.   If your skin becomes reddened/irritated stop using the CHG and inform your nurse when you arrive at Short Stay.   You may shave your face/neck.  Please follow these instructions carefully:  1.  Shower with CHG Soap the night before surgery and the  morning of Surgery.  2.  If you choose to wash your hair, wash your hair first as usual with your  normal  shampoo.  3.  After you shampoo, rinse your hair and body thoroughly to remove the  shampoo.  4.  Use CHG as you would any other liquid soap.  You can apply chg directly  to the skin and wash                       Gently with a scrungie or clean washcloth.  5.  Apply the CHG Soap to your body ONLY FROM THE NECK DOWN.   Do not use on face/ open                           Wound or open sores. Avoid contact with eyes, ears mouth and genitals (private parts).                       Wash face,  Genitals (private parts) with your normal soap.             6.  Wash thoroughly, paying special attention to the area where your surgery  will be performed.  7.  Thoroughly rinse your body with warm water from the neck down.  8.  DO NOT shower/wash with your normal soap after using and rinsing off  the CHG Soap.             9.  Pat yourself dry with a clean towel.            10.  Wear clean pajamas.            11.  Place clean sheets on your bed the night of your first shower and do not  sleep with pets. Day of Surgery : Do not apply any lotions/deodorants the morning of surgery.  Please wear clean clothes to the hospital/surgery center.  FAILURE TO FOLLOW THESE INSTRUCTIONS MAY RESULT IN THE CANCELLATION OF YOUR  SURGERY PATIENT SIGNATURE_________________________________  NURSE SIGNATURE__________________________________  ________________________________________________________________________   Adam Phenix  An incentive spirometer is a tool that can help keep your lungs clear and active. This tool measures how well you are filling your lungs with each breath. Taking long deep breaths may help reverse or decrease the chance of developing breathing (pulmonary) problems (especially infection) following:  A long period of time when you are unable to move or be active. BEFORE THE PROCEDURE   If the spirometer includes an indicator to show your best effort, your nurse or respiratory therapist will set it to a desired goal.  If possible, sit up straight or lean slightly forward. Try not to slouch.  Hold the incentive spirometer in an upright position. INSTRUCTIONS FOR USE  1. Sit on the edge of your bed if possible, or sit up as far as you can in bed or on a chair. 2. Hold the incentive spirometer in an upright position. 3. Breathe out normally. 4. Place the mouthpiece in your mouth and seal your lips tightly around it. 5. Breathe in slowly and as deeply as possible, raising the piston or the ball toward the top of the column. 6. Hold your breath for 3-5 seconds or for as long as possible. Allow the piston or ball to fall to the bottom of the column. 7. Remove the mouthpiece from your mouth and breathe out normally. 8. Rest for a few seconds and repeat Steps 1 through 7 at least 10 times every 1-2 hours when you are awake. Take your time and take a few normal breaths between deep breaths. 9. The spirometer may include an indicator to show your best effort.  Use the indicator as a goal to work toward during each repetition. 10. After each set of 10 deep breaths, practice coughing to be sure your lungs are clear. If you have an incision (the cut made at the time of surgery), support your  incision when coughing by placing a pillow or rolled up towels firmly against it. Once you are able to get out of bed, walk around indoors and cough well. You may stop using the incentive spirometer when instructed by your caregiver.  RISKS AND COMPLICATIONS  Take your time so you do not get dizzy or light-headed.  If you are in pain, you may need to take or ask for pain medication before doing incentive spirometry. It is harder to take a deep breath if you are having pain. AFTER USE  Rest and breathe slowly and easily.  It can be helpful to keep track of a log of your progress. Your caregiver can provide you with a simple table to help with this. If you are using the spirometer at home, follow these instructions: [place] IF:   You are having difficultly using the spirometer.  You have trouble using the spirometer as often as instructed.  Your pain medication is not giving enough relief while using the spirometer.  You develop fever of 100.5 F (38.1 C) or higher. SEEK IMMEDIATE MEDICAL CARE IF:   You cough up bloody sputum that had not been present before.  You develop fever of 102 F (38.9 C) or greater.  You develop worsening pain at or near the incision site. MAKE SURE YOU:   Understand these instructions.  Will watch your condition.  Will get help right away if you are not doing well or get worse. Document Released: 03/08/2007 Document Revised: 01/18/2012 Document Reviewed: 05/09/2007 [facility] Patient Information 2014 [place], [place].   ________________________________________________________________________

## 2020-10-15 ENCOUNTER — Other Ambulatory Visit: Payer: Self-pay

## 2020-10-15 ENCOUNTER — Encounter (HOSPITAL_COMMUNITY)
Admission: RE | Admit: 2020-10-15 | Discharge: 2020-10-15 | Disposition: A | Discharge status: Home / Self Care | Payer: Medicare HMO | Source: Ambulatory Visit | Attending: Orthopaedic Surgery | Admitting: Orthopaedic Surgery

## 2020-10-15 ENCOUNTER — Other Ambulatory Visit (HOSPITAL_COMMUNITY)
Admission: RE | Admit: 2020-10-15 | Discharge: 2020-10-15 | Disposition: A | Discharge status: Home / Self Care | Payer: Medicare HMO | Source: Ambulatory Visit | Attending: Orthopaedic Surgery | Admitting: Orthopaedic Surgery

## 2020-10-15 ENCOUNTER — Encounter (HOSPITAL_COMMUNITY): Payer: Self-pay

## 2020-10-15 DIAGNOSIS — Z20822 Contact with and (suspected) exposure to covid-19: Secondary | ICD-10-CM | POA: Diagnosis not present

## 2020-10-15 DIAGNOSIS — Z01812 Encounter for preprocedural laboratory examination: Secondary | ICD-10-CM | POA: Insufficient documentation

## 2020-10-15 DIAGNOSIS — S143XXD Injury of brachial plexus, subsequent encounter: Secondary | ICD-10-CM | POA: Diagnosis not present

## 2020-10-15 HISTORY — DX: Depression, unspecified: F32.A

## 2020-10-15 HISTORY — DX: Unspecified osteoarthritis, unspecified site: M19.90

## 2020-10-15 LAB — CBC
HCT: 39.6 % (ref 39.0–52.0)
Hemoglobin: 12.9 g/dL — ABNORMAL LOW (ref 13.0–17.0)
MCH: 36.4 pg — ABNORMAL HIGH (ref 26.0–34.0)
MCHC: 32.6 g/dL (ref 30.0–36.0)
MCV: 111.9 fL — ABNORMAL HIGH (ref 80.0–100.0)
Platelets: 162 10*3/uL (ref 150–400)
RBC: 3.54 MIL/uL — ABNORMAL LOW (ref 4.22–5.81)
RDW: 12.5 % (ref 11.5–15.5)
WBC: 5.4 10*3/uL (ref 4.0–10.5)
nRBC: 0 % (ref 0.0–0.2)

## 2020-10-15 LAB — SARS CORONAVIRUS 2 (TAT 6-24 HRS): SARS Coronavirus 2: NEGATIVE

## 2020-10-15 LAB — SURGICAL PCR SCREEN
MRSA, PCR: NEGATIVE
Staphylococcus aureus: NEGATIVE

## 2020-10-15 NOTE — Progress Notes (Signed)
COVID Vaccine Completed:Yes Date COVID Vaccine completed:08/18/20 COVID vaccine manufacturer:  Moderna     PCP - Dr. Aviva Signs Cardiologist - no  Chest x-ray - no EKG - no Stress Test - no ECHO - no Cardiac Cath - no Pacemaker/ICD device last checked:NA  Sleep Study - no CPAP -   Fasting Blood Sugar - NA Checks Blood Sugar _____ times a day  Blood Thinner Instructions:ASA, mobic/ Dr. Aviva Signs Aspirin Instructions:Mobic stopped 1 month prior. ASA not taken in months Last Dose:09/15/20  Anesthesia review:   Patient denies shortness of breath, fever, cough and chest pain at PAT appointment Yes  Patient verbalized understanding of instructions that were given to them at the PAT appointment. Patient was also instructed that they will need to review over the PAT instructions again at home before surgery.Yes Pt has a frozen Rt shoulder from [place] in 2016. Pt reports that he has been taking ibuprofen and advil for pain with Dr. Trevor Mace permission. 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

## 2020-10-17 DIAGNOSIS — M1611 Unilateral primary osteoarthritis, right hip: Secondary | ICD-10-CM
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
## 2020-10-18 ENCOUNTER — Observation Stay (HOSPITAL_COMMUNITY)
Admission: RE | Admit: 2020-10-18 | Discharge: 2020-10-19 | Disposition: A | Discharge status: Home / Self Care | Payer: Medicare HMO | Attending: Orthopaedic Surgery | Admitting: Orthopaedic Surgery

## 2020-10-18 ENCOUNTER — Observation Stay (HOSPITAL_COMMUNITY): Payer: Medicare HMO

## 2020-10-18 ENCOUNTER — Ambulatory Visit (HOSPITAL_COMMUNITY): Payer: Medicare HMO

## 2020-10-18 ENCOUNTER — Encounter (HOSPITAL_COMMUNITY)
Admission: RE | Disposition: A | Discharge status: Home / Self Care | Payer: Self-pay | Source: Home / Self Care | Attending: Orthopaedic Surgery

## 2020-10-18 ENCOUNTER — Other Ambulatory Visit: Payer: Self-pay

## 2020-10-18 ENCOUNTER — Encounter (HOSPITAL_COMMUNITY): Payer: Self-pay | Admitting: Orthopaedic Surgery

## 2020-10-18 ENCOUNTER — Ambulatory Visit (HOSPITAL_COMMUNITY): Payer: Medicare HMO | Admitting: Anesthesiology

## 2020-10-18 DIAGNOSIS — Z96641 Presence of right artificial hip joint: Secondary | ICD-10-CM | POA: Diagnosis not present

## 2020-10-18 DIAGNOSIS — M1611 Unilateral primary osteoarthritis, right hip: Secondary | ICD-10-CM | POA: Diagnosis not present

## 2020-10-18 DIAGNOSIS — Z471 Aftercare following joint replacement surgery: Secondary | ICD-10-CM | POA: Diagnosis not present

## 2020-10-18 DIAGNOSIS — Z8546 Personal history of malignant neoplasm of prostate: Secondary | ICD-10-CM | POA: Diagnosis not present

## 2020-10-18 DIAGNOSIS — D62 Acute posthemorrhagic anemia: Secondary | ICD-10-CM | POA: Diagnosis not present

## 2020-10-18 DIAGNOSIS — Z87891 Personal history of nicotine dependence: Secondary | ICD-10-CM | POA: Insufficient documentation

## 2020-10-18 DIAGNOSIS — Z7982 Long term (current) use of aspirin: Secondary | ICD-10-CM | POA: Insufficient documentation

## 2020-10-18 DIAGNOSIS — M25551 Pain in right hip: Secondary | ICD-10-CM | POA: Diagnosis present

## 2020-10-18 DIAGNOSIS — I739 Peripheral vascular disease, unspecified: Secondary | ICD-10-CM | POA: Diagnosis not present

## 2020-10-18 DIAGNOSIS — Z419 Encounter for procedure for purposes other than remedying health state, unspecified: Secondary | ICD-10-CM

## 2020-10-18 DIAGNOSIS — R69 Illness, unspecified: Secondary | ICD-10-CM | POA: Diagnosis not present

## 2020-10-18 HISTORY — PX: TOTAL HIP ARTHROPLASTY: SHX124

## 2020-10-18 LAB — TYPE AND SCREEN
ABO/RH(D): O POS
Antibody Screen: NEGATIVE

## 2020-10-18 SURGERY — ARTHROPLASTY, HIP, TOTAL, ANTERIOR APPROACH
Anesthesia: Monitor Anesthesia Care | Site: Hip | Laterality: Right

## 2020-10-18 MED ORDER — PHENYLEPHRINE HCL (PRESSORS) 10 MG/ML IV SOLN
INTRAVENOUS | Status: AC
  Filled 2020-10-18: qty 1

## 2020-10-18 MED ORDER — ZOLPIDEM TARTRATE 5 MG PO TABS: 5.0000 mg | ORAL_TABLET | Freq: Every evening | ORAL | Status: DC | PRN

## 2020-10-18 MED ORDER — FENTANYL CITRATE (PF) 100 MCG/2ML IJ SOLN
25.0000 ug | INTRAMUSCULAR | Status: DC | PRN
  Administered 2020-10-18 (×2): 50 ug via INTRAVENOUS

## 2020-10-18 MED ORDER — ORAL CARE MOUTH RINSE: 15.0000 mL | Freq: Once | OROMUCOSAL | Status: AC

## 2020-10-18 MED ORDER — ONDANSETRON HCL 4 MG PO TABS: 4.0000 mg | ORAL_TABLET | Freq: Four times a day (QID) | ORAL | Status: DC | PRN

## 2020-10-18 MED ORDER — MIDAZOLAM HCL 2 MG/2ML IJ SOLN
INTRAMUSCULAR | Status: AC
  Filled 2020-10-18: qty 2

## 2020-10-18 MED ORDER — MENTHOL 3 MG MT LOZG: 1.0000 | LOZENGE | OROMUCOSAL | Status: DC | PRN

## 2020-10-18 MED ORDER — ACETAMINOPHEN 500 MG PO TABS: 500.0000 mg | ORAL_TABLET | Freq: Three times a day (TID) | ORAL | Status: DC | PRN

## 2020-10-18 MED ORDER — SODIUM CHLORIDE 0.9 % IR SOLN
Status: DC | PRN
  Administered 2020-10-18: 1000 mL

## 2020-10-18 MED ORDER — DIPHENHYDRAMINE HCL 12.5 MG/5ML PO ELIX
12.5000 mg | ORAL_SOLUTION | ORAL | Status: DC | PRN
Start: 2020-10-18 — End: 2020-10-19

## 2020-10-18 MED ORDER — OXYCODONE HCL 5 MG PO TABS
5.0000 mg | ORAL_TABLET | ORAL | Status: DC | PRN
  Administered 2020-10-18 – 2020-10-19 (×3): 10 mg via ORAL
  Filled 2020-10-18 (×3): qty 2

## 2020-10-18 MED ORDER — PROPOFOL 10 MG/ML IV BOLUS
INTRAVENOUS | Status: AC
  Filled 2020-10-18: qty 20

## 2020-10-18 MED ORDER — GABAPENTIN 100 MG PO CAPS
100.0000 mg | ORAL_CAPSULE | Freq: Three times a day (TID) | ORAL | Status: DC
  Administered 2020-10-18 – 2020-10-19 (×3): 100 mg via ORAL
  Filled 2020-10-18 (×3): qty 1

## 2020-10-18 MED ORDER — CEFAZOLIN SODIUM-DEXTROSE 1-4 GM/50ML-% IV SOLN
1.0000 g | Freq: Four times a day (QID) | INTRAVENOUS | Status: AC
  Administered 2020-10-18 (×2): 1 g via INTRAVENOUS
  Filled 2020-10-18 (×2): qty 50

## 2020-10-18 MED ORDER — BUPIVACAINE IN DEXTROSE 0.75-8.25 % IT SOLN
INTRATHECAL | Status: DC | PRN
  Administered 2020-10-18: 1.8 mL via INTRATHECAL

## 2020-10-18 MED ORDER — POLYETHYLENE GLYCOL 3350 17 G PO PACK: 17.0000 g | PACK | Freq: Every day | ORAL | Status: DC | PRN

## 2020-10-18 MED ORDER — TAMSULOSIN HCL 0.4 MG PO CAPS
0.4000 mg | ORAL_CAPSULE | Freq: Every day | ORAL | Status: DC
  Administered 2020-10-18: 0.4 mg via ORAL
  Filled 2020-10-18: qty 1

## 2020-10-18 MED ORDER — ALUM & MAG HYDROXIDE-SIMETH 200-200-20 MG/5ML PO SUSP: 30.0000 mL | ORAL | Status: DC | PRN

## 2020-10-18 MED ORDER — MIDAZOLAM HCL 5 MG/5ML IJ SOLN
INTRAMUSCULAR | Status: DC | PRN
  Administered 2020-10-18 (×2): 1 mg via INTRAVENOUS

## 2020-10-18 MED ORDER — ASPIRIN 81 MG PO CHEW
81.0000 mg | CHEWABLE_TABLET | Freq: Two times a day (BID) | ORAL | Status: DC
  Administered 2020-10-18 – 2020-10-19 (×2): 81 mg via ORAL
  Filled 2020-10-18 (×2): qty 1

## 2020-10-18 MED ORDER — CEFAZOLIN SODIUM-DEXTROSE 2-4 GM/100ML-% IV SOLN
2.0000 g | INTRAVENOUS | Status: AC
  Administered 2020-10-18: 2 g via INTRAVENOUS
  Filled 2020-10-18: qty 100

## 2020-10-18 MED ORDER — ACETAMINOPHEN 325 MG PO TABS
325.0000 mg | ORAL_TABLET | Freq: Four times a day (QID) | ORAL | Status: DC | PRN
Start: 2020-10-19 — End: 2020-10-19
  Administered 2020-10-19: 650 mg via ORAL
  Filled 2020-10-18: qty 2

## 2020-10-18 MED ORDER — PANTOPRAZOLE SODIUM 40 MG PO TBEC
40.0000 mg | DELAYED_RELEASE_TABLET | Freq: Every day | ORAL | Status: DC
  Administered 2020-10-18 – 2020-10-19 (×2): 40 mg via ORAL
  Filled 2020-10-18 (×2): qty 1

## 2020-10-18 MED ORDER — SODIUM CHLORIDE 0.9 % IV SOLN: INTRAVENOUS | Status: DC

## 2020-10-18 MED ORDER — METOCLOPRAMIDE HCL 5 MG/ML IJ SOLN: 5.0000 mg | Freq: Three times a day (TID) | INTRAMUSCULAR | Status: DC | PRN

## 2020-10-18 MED ORDER — ALPRAZOLAM 0.5 MG PO TABS
0.5000 mg | ORAL_TABLET | Freq: Two times a day (BID) | ORAL | Status: DC | PRN
  Administered 2020-10-18 (×2): 0.5 mg via ORAL
  Filled 2020-10-18 (×2): qty 1

## 2020-10-18 MED ORDER — PHENOL 1.4 % MT LIQD: 1.0000 | OROMUCOSAL | Status: DC | PRN

## 2020-10-18 MED ORDER — LACTATED RINGERS IV SOLN: INTRAVENOUS | Status: DC

## 2020-10-18 MED ORDER — FENTANYL CITRATE (PF) 100 MCG/2ML IJ SOLN
INTRAMUSCULAR | Status: AC
  Filled 2020-10-18: qty 2

## 2020-10-18 MED ORDER — ONDANSETRON HCL 4 MG/2ML IJ SOLN
INTRAMUSCULAR | Status: AC
  Filled 2020-10-18: qty 2

## 2020-10-18 MED ORDER — OXYCODONE HCL 5 MG PO TABS
10.0000 mg | ORAL_TABLET | ORAL | Status: DC | PRN
  Administered 2020-10-19: 10 mg via ORAL
  Filled 2020-10-18: qty 2

## 2020-10-18 MED ORDER — METOCLOPRAMIDE HCL 5 MG PO TABS
5.0000 mg | ORAL_TABLET | Freq: Three times a day (TID) | ORAL | Status: DC | PRN
Start: 2020-10-18 — End: 2020-10-19

## 2020-10-18 MED ORDER — METHOCARBAMOL 1000 MG/10ML IJ SOLN
500.0000 mg | Freq: Four times a day (QID) | INTRAVENOUS | Status: DC | PRN
  Filled 2020-10-18: qty 5

## 2020-10-18 MED ORDER — CELECOXIB 200 MG PO CAPS: 200.0000 mg | ORAL_CAPSULE | Freq: Once | ORAL | Status: DC

## 2020-10-18 MED ORDER — PROPOFOL 500 MG/50ML IV EMUL
INTRAVENOUS | Status: DC | PRN
  Administered 2020-10-18: 50 ug/kg/min via INTRAVENOUS

## 2020-10-18 MED ORDER — CHLORHEXIDINE GLUCONATE 0.12 % MT SOLN
15.0000 mL | Freq: Once | OROMUCOSAL | Status: AC
  Administered 2020-10-18: 15 mL via OROMUCOSAL

## 2020-10-18 MED ORDER — LORAZEPAM 1 MG PO TABS
1.0000 mg | ORAL_TABLET | ORAL | Status: DC | PRN
  Administered 2020-10-19: 1 mg via ORAL
  Filled 2020-10-18: qty 1

## 2020-10-18 MED ORDER — PROPOFOL 1000 MG/100ML IV EMUL
INTRAVENOUS | Status: AC
  Filled 2020-10-18: qty 100

## 2020-10-18 MED ORDER — KETOROLAC TROMETHAMINE 15 MG/ML IJ SOLN
7.5000 mg | Freq: Four times a day (QID) | INTRAMUSCULAR | Status: DC
  Administered 2020-10-18 – 2020-10-19 (×3): 7.5 mg via INTRAVENOUS
  Filled 2020-10-18 (×3): qty 1

## 2020-10-18 MED ORDER — DOCUSATE SODIUM 100 MG PO CAPS
100.0000 mg | ORAL_CAPSULE | Freq: Two times a day (BID) | ORAL | Status: DC
  Administered 2020-10-18 – 2020-10-19 (×2): 100 mg via ORAL
  Filled 2020-10-18 (×2): qty 1

## 2020-10-18 MED ORDER — FENTANYL CITRATE (PF) 100 MCG/2ML IJ SOLN
INTRAMUSCULAR | Status: DC | PRN
  Administered 2020-10-18 (×2): 50 ug via INTRAVENOUS

## 2020-10-18 MED ORDER — PROPOFOL 10 MG/ML IV BOLUS
INTRAVENOUS | Status: DC | PRN
  Administered 2020-10-18: 20 mg via INTRAVENOUS
  Administered 2020-10-18: 10 mg via INTRAVENOUS

## 2020-10-18 MED ORDER — TRANEXAMIC ACID-NACL 1000-0.7 MG/100ML-% IV SOLN
1000.0000 mg | INTRAVENOUS | Status: AC
  Administered 2020-10-18: 1000 mg via INTRAVENOUS
  Filled 2020-10-18: qty 100

## 2020-10-18 MED ORDER — DEXAMETHASONE SODIUM PHOSPHATE 10 MG/ML IJ SOLN
INTRAMUSCULAR | Status: AC
  Filled 2020-10-18: qty 1

## 2020-10-18 MED ORDER — HYDROMORPHONE HCL 1 MG/ML IJ SOLN
0.5000 mg | INTRAMUSCULAR | Status: DC | PRN
  Administered 2020-10-18: 0.5 mg via INTRAVENOUS
  Administered 2020-10-18: 1 mg via INTRAVENOUS
  Filled 2020-10-18 (×3): qty 1

## 2020-10-18 MED ORDER — ACETAMINOPHEN 500 MG PO TABS: 1000.0000 mg | ORAL_TABLET | Freq: Once | ORAL | Status: DC

## 2020-10-18 MED ORDER — METHOCARBAMOL 500 MG PO TABS
500.0000 mg | ORAL_TABLET | Freq: Four times a day (QID) | ORAL | Status: DC | PRN
  Administered 2020-10-18 – 2020-10-19 (×3): 500 mg via ORAL
  Filled 2020-10-18 (×3): qty 1

## 2020-10-18 MED ORDER — ONDANSETRON HCL 4 MG/2ML IJ SOLN
4.0000 mg | Freq: Four times a day (QID) | INTRAMUSCULAR | Status: DC | PRN
  Administered 2020-10-18: 4 mg via INTRAVENOUS
  Filled 2020-10-18: qty 2

## 2020-10-18 MED ORDER — 0.9 % SODIUM CHLORIDE (POUR BTL) OPTIME
TOPICAL | Status: DC | PRN
  Administered 2020-10-18: 1000 mL

## 2020-10-18 MED ORDER — ONDANSETRON HCL 4 MG/2ML IJ SOLN
4.0000 mg | Freq: Once | INTRAMUSCULAR | Status: AC
  Administered 2020-10-18: 4 mg via INTRAVENOUS

## 2020-10-18 MED ORDER — POVIDONE-IODINE 10 % EX SWAB
2.0000 "application " | Freq: Once | CUTANEOUS | Status: AC
  Administered 2020-10-18: 2 via TOPICAL

## 2020-10-18 MED ORDER — PHENYLEPHRINE HCL-NACL 10-0.9 MG/250ML-% IV SOLN
INTRAVENOUS | Status: DC | PRN
  Administered 2020-10-18: 25 ug/min via INTRAVENOUS

## 2020-10-18 MED ORDER — STERILE WATER FOR IRRIGATION IR SOLN
Status: DC | PRN
  Administered 2020-10-18: 2000 mL

## 2020-10-18 SURGICAL SUPPLY — 42 items
ACETAB CUP W/GRIPTION 54 (Plate) ×2 IMPLANT
APL SKNCLS STERI-STRIP NONHPOA (GAUZE/BANDAGES/DRESSINGS)
BAG SPEC THK2 15X12 ZIP CLS (MISCELLANEOUS)
BAG ZIPLOCK 12X15 (MISCELLANEOUS) IMPLANT
BENZOIN TINCTURE PRP APPL 2/3 (GAUZE/BANDAGES/DRESSINGS) IMPLANT
BLADE SAW SGTL 18X1.27X75 (BLADE) ×2 IMPLANT
COLLAR OFFSET CORAIL SZ 10 HIP (Stem) IMPLANT
CORAIL OFFSET COLLAR SZ 10 HIP (Stem) ×2 IMPLANT
COVER PERINEAL POST (MISCELLANEOUS) ×2 IMPLANT
COVER SURGICAL LIGHT HANDLE (MISCELLANEOUS) ×2 IMPLANT
COVER WAND RF STERILE (DRAPES) ×2 IMPLANT
CUP ACETAB W/GRIPTION 54 (Plate) IMPLANT
DRAPE STERI IOBAN 125X83 (DRAPES) ×2 IMPLANT
DRAPE U-SHAPE 47X51 STRL (DRAPES) ×4 IMPLANT
DRSG AQUACEL AG ADV 3.5X10 (GAUZE/BANDAGES/DRESSINGS) ×2 IMPLANT
DURAPREP 26ML APPLICATOR (WOUND CARE) ×2 IMPLANT
ELECT REM PT RETURN 15FT ADLT (MISCELLANEOUS) ×2 IMPLANT
GAUZE XEROFORM 1X8 LF (GAUZE/BANDAGES/DRESSINGS) ×2 IMPLANT
GLOVE BIO SURGEON STRL SZ7.5 (GLOVE) ×2 IMPLANT
GLOVE BIOGEL PI IND STRL 8 (GLOVE) ×2 IMPLANT
GLOVE BIOGEL PI INDICATOR 8 (GLOVE) ×2
GLOVE ECLIPSE 8.0 STRL XLNG CF (GLOVE) ×2 IMPLANT
GOWN STRL REUS W/TWL XL LVL3 (GOWN DISPOSABLE) ×4 IMPLANT
HANDPIECE INTERPULSE COAX TIP (DISPOSABLE) ×2
HEAD M SROM 36MM PLUS 1.5 (Hips) IMPLANT
HOLDER FOLEY CATH W/STRAP (MISCELLANEOUS) ×2 IMPLANT
KIT TURNOVER KIT A (KITS) IMPLANT
LINER NEUTRAL 54X36MM PLUS 4 (Hips) ×1 IMPLANT
PACK ANTERIOR HIP CUSTOM (KITS) ×2 IMPLANT
PENCIL SMOKE EVACUATOR (MISCELLANEOUS) IMPLANT
SET HNDPC FAN SPRY TIP SCT (DISPOSABLE) ×1 IMPLANT
SROM M HEAD 36MM PLUS 1.5 (Hips) ×2 IMPLANT
STAPLER VISISTAT 35W (STAPLE) IMPLANT
STRIP CLOSURE SKIN 1/2X4 (GAUZE/BANDAGES/DRESSINGS) IMPLANT
SUT ETHIBOND NAB CT1 #1 30IN (SUTURE) ×2 IMPLANT
SUT ETHILON 2 0 PS N (SUTURE) IMPLANT
SUT MNCRL AB 4-0 PS2 18 (SUTURE) IMPLANT
SUT VIC AB 0 CT1 36 (SUTURE) ×2 IMPLANT
SUT VIC AB 1 CT1 36 (SUTURE) ×2 IMPLANT
SUT VIC AB 2-0 CT1 27 (SUTURE) ×4
SUT VIC AB 2-0 CT1 TAPERPNT 27 (SUTURE) ×2 IMPLANT
TRAY FOLEY MTR SLVR 16FR STAT (SET/KITS/TRAYS/PACK) IMPLANT

## 2020-10-18 NOTE — Plan of Care (Signed)
  Problem: Activity: Goal: Risk for activity intolerance will decrease Outcome: Progressing   Problem: Coping: Goal: Level of anxiety will decrease Outcome: Progressing   Problem: Elimination: Goal: Will not experience complications related to bowel motility Outcome: Progressing   Problem: Skin Integrity: Goal: Risk for impaired skin integrity will decrease Outcome: Progressing
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
## 2020-10-18 NOTE — Brief Op Note (Signed)
10/18/2020  11:17 AM  PATIENT:  Austin Hopkins  59 y.o. male  PRE-OPERATIVE DIAGNOSIS:  osteoarthritis right hip  POST-OPERATIVE DIAGNOSIS:  osteoarthritis right hip  PROCEDURE:  Procedure(s): RIGHT TOTAL HIP ARTHROPLASTY ANTERIOR APPROACH (Right)  SURGEON:  Surgeon(s) and Role:    [name], MD - Primary  PHYSICIAN ASSISTANT:   Benita Stabile, PA-C  ANESTHESIA:   spinal  EBL:  100 mL   COUNTS:  YES  DICTATION: .Other Dictation: Dictation Number [id number]  PLAN OF CARE: Admit for overnight observation  PATIENT DISPOSITION:  PACU - hemodynamically stable.   Delay start of Pharmacological VTE agent (>24hrs) due to surgical blood loss or risk of bleeding: no

## 2020-10-18 NOTE — Anesthesia Preprocedure Evaluation (Addendum)
Anesthesia Evaluation  Patient identified by MRN, date of birth, ID band Patient awake    Reviewed: Allergy & Precautions, H&P , NPO status , Patient's Chart, lab work & pertinent test results  Airway Mallampati: III  TM Distance: >3 FB Neck ROM: Full    Dental no notable dental hx. (+) Teeth Intact, Dental Advisory Given   Pulmonary neg pulmonary ROS, former smoker,    Pulmonary exam normal breath sounds clear to auscultation       Cardiovascular negative cardio ROS   Rhythm:Regular Rate:Normal     Neuro/Psych Anxiety Depression negative neurological ROS     GI/Hepatic negative GI ROS, Neg liver ROS,   Endo/Other  negative endocrine ROS  Renal/GU negative Renal ROS  negative genitourinary   Musculoskeletal  (+) Arthritis ,   Abdominal   Peds  Hematology  (+) Blood dyscrasia, anemia ,   Anesthesia Other Findings   Reproductive/Obstetrics negative OB ROS                            Anesthesia Physical Anesthesia Plan  ASA: II  Anesthesia Plan: MAC and Spinal   Post-op Pain Management:    Induction: Intravenous  PONV Risk Score and Plan: 2 and Propofol infusion, Dexamethasone, Ondansetron and Midazolam  Airway Management Planned: Simple Face Mask  Additional Equipment:   Intra-op Plan:   Post-operative Plan:   Informed Consent: I have reviewed the patients History and Physical, chart, labs and discussed the procedure including the risks, benefits and alternatives for the proposed anesthesia with the patient or authorized representative who has indicated his/her understanding and acceptance.     Dental advisory given  Plan Discussed with: CRNA  Anesthesia Plan Comments:         Anesthesia Quick Evaluation

## 2020-10-18 NOTE — Plan of Care (Signed)
  Problem: Education: Goal: Knowledge of the prescribed therapeutic regimen will improve Outcome: Progressing Goal: Understanding of discharge needs will improve Outcome: Progressing Goal: Individualized Educational Video(s) Outcome: Progressing   Problem: Pain Management: Goal: Pain level will decrease with appropriate interventions Outcome: Progressing   

## 2020-10-18 NOTE — Anesthesia Procedure Notes (Signed)
Spinal  Patient location during procedure: OR Start time: 10/18/2020 9:52 AM End time: 10/18/2020 9:58 AM Staffing Performed: anesthesiologist  Anesthesiologist: Roderic Palau, MD Preanesthetic Checklist Completed: patient identified, IV checked, risks and benefits discussed, surgical consent, monitors and equipment checked, pre-op evaluation and timeout performed Spinal Block Patient position: sitting Prep: DuraPrep Patient monitoring: cardiac monitor, continuous pulse ox and blood pressure Approach: right paramedian Location: L3-4 Injection technique: single-shot Needle Needle type: Quincke (Attempted Pencan 24G. Unable to locate space.)  Needle gauge: 22 G Needle length: 9 cm Assessment Sensory level: T8 Additional Notes Functioning IV was confirmed and monitors were applied. Sterile prep and drape, including hand hygiene and sterile gloves were used. The patient was positioned and the spine was prepped. The skin was anesthetized with lidocaine.  Free flow of clear CSF was obtained prior to injecting local anesthetic into the CSF.  The spinal needle aspirated freely following injection.  The needle was carefully withdrawn.  The patient tolerated the procedure well.

## 2020-10-18 NOTE — Anesthesia Procedure Notes (Addendum)
Procedure Name: MAC Date/Time: 10/18/2020 9:38 AM Performed by: Lissa Morales, CRNA Pre-anesthesia Checklist: Patient identified, Emergency Drugs available, Suction available, Patient being monitored and Timeout performed Patient Re-evaluated:Patient Re-evaluated prior to induction Oxygen Delivery Method: Simple face mask Placement Confirmation: positive ETCO2

## 2020-10-18 NOTE — Anesthesia Postprocedure Evaluation (Signed)
Anesthesia Post Note  Patient: CLEVON KHADER  Procedure(s) Performed: RIGHT TOTAL HIP ARTHROPLASTY ANTERIOR APPROACH (Right Hip)     Patient location during evaluation: PACU Anesthesia Type: MAC and Spinal Level of consciousness: oriented and awake and alert Pain management: pain level controlled Vital Signs Assessment: post-procedure vital signs reviewed and stable Respiratory status: spontaneous breathing and respiratory function stable Cardiovascular status: blood pressure returned to baseline and stable Postop Assessment: no headache, no backache, no apparent nausea or vomiting, spinal receding and patient able to bend at knees Anesthetic complications: no   No complications documented.  Last Vitals:  Vitals:   10/18/20 1345 10/18/20 1407  BP: (!) 158/79 (!) 158/82  Pulse: 64 67  Resp: 16 18  Temp:  36.8 C  SpO2: 99% 98%    Last Pain:  Vitals:   10/18/20 1407  TempSrc: Oral  PainSc:                  Selisa Tensley,W. EDMOND

## 2020-10-18 NOTE — Interval H&P Note (Signed)
History and Physical Interval Note: The patient understands that he is here today for a right total hip arthroplasty to treat the severe pain from his right hip osteoarthritis.  There is been no acute changes in his medical status.  See recent H&P.  The risk and benefits of surgery have been explained in detail and informed consent is obtained.  The right hip is been marked.  10/18/2020 8:38 AM  Austin Hopkins  has presented today for surgery, with the diagnosis of osteoarthritis right hip.  The various methods of treatment have been discussed with the patient and family. After consideration of risks, benefits and other options for treatment, the patient has consented to  Procedure(s): RIGHT TOTAL HIP ARTHROPLASTY ANTERIOR APPROACH (Right) as a surgical intervention.  The patient's history has been reviewed, patient examined, no change in status, stable for surgery.  I have reviewed the patient's chart and labs.  Questions were answered to the patient's satisfaction.     [name]

## 2020-10-18 NOTE — Transfer of Care (Signed)
Immediate Anesthesia Transfer of Care Note  Patient: Austin Hopkins  Procedure(s) Performed: RIGHT TOTAL HIP ARTHROPLASTY ANTERIOR APPROACH (Right Hip)  Patient Location: PACU  Anesthesia Type:Spinal  Level of Consciousness: awake, drowsy and patient cooperative  Airway & Oxygen Therapy: Patient Spontanous Breathing and Patient connected to face mask oxygen  Post-op Assessment: Report given to RN and Post -op Vital signs reviewed and stable  Post vital signs: Reviewed and stable  Last Vitals:  Vitals Value Taken Time  BP 122/97 10/18/20 1138  Temp    Pulse 69 10/18/20 1139  Resp 10 10/18/20 1139  SpO2 100 % 10/18/20 1139  Vitals shown include unvalidated device data.  Last Pain:  Vitals:   10/18/20 0737  TempSrc:   PainSc: 7       Patients Stated Pain Goal: 6 (09/81/19 1478)  Complications: No complications documented.

## 2020-10-19 DIAGNOSIS — M1611 Unilateral primary osteoarthritis, right hip: Secondary | ICD-10-CM | POA: Diagnosis not present

## 2020-10-19 LAB — BASIC METABOLIC PANEL
Anion gap: 12 (ref 5–15)
BUN: 20 mg/dL (ref 6–20)
CO2: 24 mmol/L (ref 22–32)
Calcium: 8.5 mg/dL — ABNORMAL LOW (ref 8.9–10.3)
Chloride: 99 mmol/L (ref 98–111)
Creatinine, Ser: 0.88 mg/dL (ref 0.61–1.24)
GFR, Estimated: >60 mL/min (ref >60)
Glucose, Bld: 136 mg/dL — ABNORMAL HIGH (ref 70–99)
Potassium: 4.7 mmol/L (ref 3.5–5.1)
Sodium: 135 mmol/L (ref 135–145)

## 2020-10-19 LAB — CBC
HCT: 35.6 % — ABNORMAL LOW (ref 39.0–52.0)
Hemoglobin: 11.6 g/dL — ABNORMAL LOW (ref 13.0–17.0)
MCH: 36 pg — ABNORMAL HIGH (ref 26.0–34.0)
MCHC: 32.6 g/dL (ref 30.0–36.0)
MCV: 110.6 fL — ABNORMAL HIGH (ref 80.0–100.0)
Platelets: 125 10*3/uL — ABNORMAL LOW (ref 150–400)
RBC: 3.22 MIL/uL — ABNORMAL LOW (ref 4.22–5.81)
RDW: 12.1 % (ref 11.5–15.5)
WBC: 7 10*3/uL (ref 4.0–10.5)
nRBC: 0 % (ref 0.0–0.2)

## 2020-10-19 MED ORDER — METHOCARBAMOL 500 MG PO TABS: 500.0000 mg | ORAL_TABLET | Freq: Four times a day (QID) | ORAL | 1 refills | Status: DC | PRN

## 2020-10-19 MED ORDER — OXYCODONE HCL 5 MG PO TABS: 5.0000 mg | ORAL_TABLET | ORAL | 0 refills | Status: DC | PRN

## 2020-10-19 MED ORDER — ASPIRIN 81 MG PO CHEW: 81.0000 mg | CHEWABLE_TABLET | Freq: Two times a day (BID) | ORAL | 0 refills | Status: DC

## 2020-10-19 MED ORDER — LIP MEDEX EX OINT
TOPICAL_OINTMENT | CUTANEOUS | Status: AC
  Administered 2020-10-19: 1
  Filled 2020-10-19: qty 7

## 2020-10-19 NOTE — Discharge Instructions (Signed)

## 2020-10-19 NOTE — TOC Progression Note (Signed)
Transition of Care [facility]) - Progression Note    Patient Details  Name: Austin Hopkins MRN: [id number] Date of Birth: 01-15-1961  [facility] CM/SW Contact  Joaquin Courts, RN Phone Number: 10/19/2020, 10:13 AM  Clinical Narrative:    CM spoke with patient who reports he needs a rolling walker but declines 3in1.  [employer] notified and will deliver rolling walker to bedside.  [facility] to provide HHPT.   Expected Discharge Plan: [place] Barriers to Discharge: Continued Medical Work up  Expected Discharge Plan and Services Expected Discharge Plan: [place]   Discharge Planning Services: CM Consult Post Acute Care Choice: [place] arrangements for the past 2 months: Single Family Home                 DME Arranged: Walker rolling DME Agency: [employer] Date DME Agency Contacted: 10/19/20 Time DME Agency Contacted: [id number] Representative spoke with at DME Agency: [place]: PT [place]: [facility] [employer] [facility] [employer]     Representative spoke with at [place]: pre-arranged in md office   Social Determinants of Health ([place]) Interventions    Readmission Risk Interventions No flowsheet data found.

## 2020-10-19 NOTE — Discharge Summary (Signed)
Patient ID: Austin Hopkins MRN: [id number] DOB/AGE: 60-Sep-1962 60 y.o.  Admit date: 10/18/2020 Discharge date: 10/19/2020  Admission Diagnoses:  Principal Problem:   Unilateral primary osteoarthritis, right hip Active Problems:   Status post total replacement of right hip   Discharge Diagnoses:  Same  Past Medical History:  Diagnosis Date  . Anxiety   . Arthritis    hips,knee  . Cancer [facility])    PROSTATE CANCER  . Depression   . HNP (herniated nucleus pulposus)    HX OF HNP L3-4 -- PT HAS RIGHT HIP AND LEG PAIN - NO LUMBAR SURGERY  . Pain    RIGHT BICEPS-HX OF SURGERY TO REPAIR BICEP TENDON- PT HAS SEVERE PAIN -DOESN'T LIKE FOR ANYONE TO TOUCH THAT ARM OR DO B/P'S    Surgeries: Procedure(s): RIGHT TOTAL HIP ARTHROPLASTY ANTERIOR APPROACH on 10/18/2020   Consultants:   Discharged Condition: Improved  Hospital Course: Austin Hopkins is an 58 y.o. male who was admitted 10/18/2020 for operative treatment ofUnilateral primary osteoarthritis, right hip. Patient has severe unremitting pain that affects sleep, daily activities, and work/hobbies. After pre-op clearance the patient was taken to the operating room on 10/18/2020 and underwent  Procedure(s): RIGHT TOTAL HIP ARTHROPLASTY ANTERIOR APPROACH.    Patient was given perioperative antibiotics:  Anti-infectives (From admission, onward)   Start     Dose/Rate Route Frequency Ordered Stop   10/18/20 1600  ceFAZolin (ANCEF) IVPB 1 g/50 mL premix        1 g 100 mL/hr over 30 Minutes Intravenous Every 6 hours 10/18/20 1410 10/18/20 2240   10/18/20 0730  ceFAZolin (ANCEF) IVPB 2g/100 mL premix        2 g 200 mL/hr over 30 Minutes Intravenous On call to O.R. 10/18/20 5102 10/18/20 0940       Patient was given sequential compression devices, early ambulation, and chemoprophylaxis to prevent DVT.  Patient benefited maximally from hospital stay and there were no complications.    Recent vital signs:  Patient Vitals for the  past 24 hrs:  BP Temp Temp src Pulse Resp SpO2 Height Weight  10/19/20 1000 (!) 153/94 97.8 F (36.6 C) Oral 68 18 100 % -- --  10/19/20 0628 (!) 159/74 98.8 F (37.1 C) Oral 64 16 97 % -- --  10/19/20 0237 (!) 177/81 98.2 F (36.8 C) Oral 67 16 98 % -- --  10/18/20 2030 (!) 173/79 97.7 F (36.5 C) Oral 64 16 100 % -- --  10/18/20 1840 (!) 189/80 -- -- 68 -- -- -- --  10/18/20 1721 (!) 194/84 98.3 F (36.8 C) Oral 67 17 99 % -- --  10/18/20 1619 (!) 191/87 98.4 F (36.9 C) Oral 65 16 99 % -- --  10/18/20 1518 (!) 167/90 97.7 F (36.5 C) Oral 64 18 99 % -- --  10/18/20 1407 (!) 158/82 98.2 F (36.8 C) Oral 67 18 98 % 5\' 10"  (1.778 m) 101.2 kg  10/18/20 1345 (!) 158/79 -- -- 64 16 99 % -- --  10/18/20 1330 (!) 149/74 98.2 F (36.8 C) -- 64 19 97 % -- --  10/18/20 1315 (!) 153/77 -- -- 66 16 99 % -- --  10/18/20 1300 (!) 151/77 -- -- 63 11 99 % -- --  10/18/20 1245 (!) 164/74 -- -- 62 13 100 % -- --  10/18/20 1230 (!) 147/70 98 F (36.7 C) -- 65 13 99 % -- --  10/18/20 1215 133/71 -- -- 63 10 100 % -- --  10/18/20 1200 (!) 145/71 -- -- 66 11 100 % -- --  10/18/20 1145 136/72 -- -- 66 10 100 % -- --  10/18/20 1139 (!) 122/97 98 F (36.7 C) -- 69 10 100 % -- --     Recent laboratory studies:  Recent Labs    10/19/20 0325  WBC 7.0  HGB 11.6*  HCT 35.6*  PLT 125*  NA 135  K 4.7  CL 99  CO2 24  BUN 20  CREATININE 0.88  GLUCOSE 136*  CALCIUM 8.5*     Discharge Medications:   Allergies as of 10/19/2020   No Known Allergies     Medication List    STOP taking these medications   aspirin EC 81 MG tablet Replaced by: aspirin 81 MG chewable tablet   HYDROcodone-acetaminophen 7.5-325 MG tablet Commonly known as: NORCO     TAKE these medications   acetaminophen 500 MG tablet Commonly known as: TYLENOL Take 500 mg by mouth every 8 (eight) hours as needed for moderate pain.   ALPRAZolam 0.5 MG tablet Commonly known as: XANAX Take 1 tablet by mouth 2 (two) times  daily as needed for anxiety or sleep. For anxiety   aspirin 81 MG chewable tablet Chew 1 tablet (81 mg total) by mouth 2 (two) times daily. Replaces: aspirin EC 81 MG tablet   CoQ10 100 MG Caps Take 100 mg by mouth daily as needed (pt preference).   eszopiclone 2 MG Tabs tablet Commonly known as: LUNESTA Take 2 mg by mouth at bedtime. Take immediately before bedtime   ibuprofen 200 MG tablet Commonly known as: ADVIL Take 200 mg by mouth every 8 (eight) hours as needed for moderate pain.   methocarbamol 500 MG tablet Commonly known as: ROBAXIN Take 1 tablet (500 mg total) by mouth every 6 (six) hours as needed for muscle spasms.   naproxen sodium 220 MG tablet Commonly known as: ALEVE Take 220 mg by mouth daily as needed (pain).   ondansetron 8 MG tablet Commonly known as: ZOFRAN Take 8 mg by mouth every 8 (eight) hours as needed for nausea or vomiting.   oxyCODONE 5 MG immediate release tablet Commonly known as: Oxy IR/ROXICODONE Take 1-2 tablets (5-10 mg total) by mouth every 4 (four) hours as needed for moderate pain (pain score 4-6).            Durable Medical Equipment  (From admission, onward)         Start     Ordered   10/18/20 1411  DME 3 n 1  Once        10/18/20 1410   10/18/20 1411  DME Walker rolling  Once       Question Answer Comment  Walker: With 5 Inch Wheels   Patient needs a walker to treat with the following condition Status post total replacement of right hip      10/18/20 1410          Diagnostic Studies: DG Pelvis Portable  Result Date: 10/18/2020 CLINICAL DATA:  Right hip arthroplasty. EXAM: PORTABLE PELVIS 1-2 VIEWS COMPARISON:  Prior study same day. FINDINGS: Total right hip replacement. Hardware intact. Anatomic alignment. No acute bony abnormality. Degenerative changes lumbar spine and left hip. Peripheral vascular calcification. IMPRESSION: 1. Total right hip replacement with anatomic alignment. 2. Peripheral vascular disease.  Electronically Signed   By: Marcello Moores  Register   On: 10/18/2020 12:08   DG C-Arm 1-60 Min-No Report  Result Date: 10/18/2020 Fluoroscopy was utilized by the  requesting physician.  No radiographic interpretation.   DG HIP OPERATIVE UNILAT W OR W/O PELVIS RIGHT  Result Date: 10/18/2020 CLINICAL DATA:  Right hip replacement. EXAM: OPERATIVE right HIP (WITH PELVIS IF PERFORMED) 2 VIEWS TECHNIQUE: Fluoroscopic spot image(s) were submitted for interpretation post-operatively. COMPARISON:  Right hip radiographs 08/21/2020 FINDINGS: Internal and externally rotated views are submitted from the operating room. Right total hip arthroplasty is noted. The prosthesis is located. No acute fractures are present. IMPRESSION: Right total hip arthroplasty without radiographic evidence for complication. Electronically Signed   By: San Morelle M.D.   On: 10/18/2020 11:30    Disposition: Discharge disposition: 01-Home or [place]    [name], MD Follow up in 2 week(s).   Specialty: Orthopedic Surgery Contact information: [place] [address] [phone]        [facility] Follow up.   Specialty: Home Health Services Why: agency will provide physical therapy. Contact information: [address] [place] [address] [phone]                Signed: [name] 10/19/2020, 10:41 AM
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
## 2020-10-19 NOTE — Progress Notes (Signed)
  Subjective: Patient stable.  Pain reasonably well-controlled but he did not sleep well last night.   Objective: Vital signs in last 24 hours: Temp:  [97.7 F (36.5 C)-98.8 F (37.1 C)] 98.8 F (37.1 C) (12/11 0628) Pulse Rate:  [62-69] 64 (12/11 0628) Resp:  [10-19] 16 (12/11 0628) BP: (122-194)/(70-97) 159/74 (12/11 0628) SpO2:  [97 %-100 %] 97 % (12/11 0628) Weight:  [101.2 kg] 101.2 kg (12/10 1407)  Intake/Output from previous day: 12/10 0701 - 12/11 0700 In: 4248.9 [P.O.:720; I.V.:3228.9; IV Piggyback:300] Out: 525 [Urine:425; Blood:100] Intake/Output this shift: No intake/output data recorded.  Exam:  Sensation intact distally Intact pulses distally Dorsiflexion/Plantar flexion intact No cellulitis present  Labs: Recent Labs    10/19/20 0325  HGB 11.6*   Recent Labs    10/19/20 0325  WBC 7.0  RBC 3.22*  HCT 35.6*  PLT 125*   Recent Labs    10/19/20 0325  NA 135  K 4.7  CL 99  CO2 24  BUN 20  CREATININE 0.88  GLUCOSE 136*  CALCIUM 8.5*   No results for input(s): LABPT, INR in the last 72 hours.  Assessment/Plan: Impression is patient is doing moderately well his right hip replacement.  Leg lengths equal.  He has transportation issues and would very much like to go home today.  We will see how he does with physical therapy.   Landry Dyke Labrian Torregrossa 10/19/2020, 9:19 AM
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
## 2020-10-21 ENCOUNTER — Encounter (HOSPITAL_COMMUNITY): Payer: Self-pay | Admitting: Orthopaedic Surgery

## 2020-10-21 DIAGNOSIS — Z9181 History of falling: Secondary | ICD-10-CM | POA: Diagnosis not present

## 2020-10-21 DIAGNOSIS — Z471 Aftercare following joint replacement surgery: Secondary | ICD-10-CM | POA: Diagnosis not present

## 2020-10-21 DIAGNOSIS — R69 Illness, unspecified: Secondary | ICD-10-CM | POA: Diagnosis not present

## 2020-10-21 DIAGNOSIS — Z96641 Presence of right artificial hip joint: Secondary | ICD-10-CM | POA: Diagnosis not present

## 2020-10-21 DIAGNOSIS — D62 Acute posthemorrhagic anemia: Secondary | ICD-10-CM | POA: Diagnosis not present

## 2020-10-21 DIAGNOSIS — Z8546 Personal history of malignant neoplasm of prostate: Secondary | ICD-10-CM | POA: Diagnosis not present

## 2020-10-23 ENCOUNTER — Telehealth: Payer: Self-pay | Admitting: Orthopaedic Surgery

## 2020-10-23 ENCOUNTER — Other Ambulatory Visit: Payer: Self-pay | Admitting: Orthopaedic Surgery

## 2020-10-23 DIAGNOSIS — D62 Acute posthemorrhagic anemia: Secondary | ICD-10-CM | POA: Diagnosis not present

## 2020-10-23 DIAGNOSIS — Z471 Aftercare following joint replacement surgery: Secondary | ICD-10-CM | POA: Diagnosis not present

## 2020-10-23 DIAGNOSIS — R69 Illness, unspecified: Secondary | ICD-10-CM | POA: Diagnosis not present

## 2020-10-23 DIAGNOSIS — Z9181 History of falling: Secondary | ICD-10-CM | POA: Diagnosis not present

## 2020-10-23 DIAGNOSIS — Z8546 Personal history of malignant neoplasm of prostate: Secondary | ICD-10-CM | POA: Diagnosis not present

## 2020-10-23 DIAGNOSIS — Z96641 Presence of right artificial hip joint: Secondary | ICD-10-CM | POA: Diagnosis not present

## 2020-10-23 MED ORDER — HYDROCODONE-ACETAMINOPHEN 5-325 MG PO TABS: 1.0000 | ORAL_TABLET | Freq: Four times a day (QID) | ORAL | 0 refills | Status: DC | PRN

## 2020-10-23 NOTE — Telephone Encounter (Signed)
I will send in some hydrocodone to [facility].  Let him know that I will refill this over the next month.  However, I cannot send in a month supply because current narcotics prescribing laws only allow me to send in a small amount of the time.  When he runs out just have him let us know that we can keep refilling it.

## 2020-10-23 NOTE — Telephone Encounter (Signed)
Patient called. He would like to know if he can stop taking the oxycodone and just take the hydrocodone. His call back number is [phone]

## 2020-10-23 NOTE — Telephone Encounter (Signed)
Pt was getting hydrocodone from his pcp before sx. He contacted his pcp about a refill for the hydrocodone and was told by his pcp that dr. Ninfa Linden would need to fill it for the next month since he had surgery.He would like if dr. Ninfa Linden would send in 1 month worth of hydrocodone for him since the oxy is making him feel sick.

## 2020-10-24 DIAGNOSIS — D62 Acute posthemorrhagic anemia: Secondary | ICD-10-CM | POA: Diagnosis not present

## 2020-10-24 DIAGNOSIS — Z96641 Presence of right artificial hip joint: Secondary | ICD-10-CM | POA: Diagnosis not present

## 2020-10-24 DIAGNOSIS — Z9181 History of falling: Secondary | ICD-10-CM | POA: Diagnosis not present

## 2020-10-24 DIAGNOSIS — Z8546 Personal history of malignant neoplasm of prostate: Secondary | ICD-10-CM | POA: Diagnosis not present

## 2020-10-24 DIAGNOSIS — Z471 Aftercare following joint replacement surgery: Secondary | ICD-10-CM | POA: Diagnosis not present

## 2020-10-24 DIAGNOSIS — R69 Illness, unspecified: Secondary | ICD-10-CM | POA: Diagnosis not present

## 2020-10-24 NOTE — Telephone Encounter (Signed)
Lvm informing pt.

## 2020-10-28 DIAGNOSIS — D62 Acute posthemorrhagic anemia: Secondary | ICD-10-CM | POA: Diagnosis not present

## 2020-10-28 DIAGNOSIS — Z471 Aftercare following joint replacement surgery: Secondary | ICD-10-CM | POA: Diagnosis not present

## 2020-10-28 DIAGNOSIS — Z96641 Presence of right artificial hip joint: Secondary | ICD-10-CM | POA: Diagnosis not present

## 2020-10-28 DIAGNOSIS — R69 Illness, unspecified: Secondary | ICD-10-CM | POA: Diagnosis not present

## 2020-10-28 DIAGNOSIS — Z8546 Personal history of malignant neoplasm of prostate: Secondary | ICD-10-CM | POA: Diagnosis not present

## 2020-10-28 DIAGNOSIS — Z9181 History of falling: Secondary | ICD-10-CM | POA: Diagnosis not present

## 2020-10-30 DIAGNOSIS — R69 Illness, unspecified: Secondary | ICD-10-CM | POA: Diagnosis not present

## 2020-10-30 DIAGNOSIS — D62 Acute posthemorrhagic anemia: Secondary | ICD-10-CM | POA: Diagnosis not present

## 2020-10-30 DIAGNOSIS — Z96641 Presence of right artificial hip joint: Secondary | ICD-10-CM | POA: Diagnosis not present

## 2020-10-30 DIAGNOSIS — Z8546 Personal history of malignant neoplasm of prostate: Secondary | ICD-10-CM | POA: Diagnosis not present

## 2020-10-30 DIAGNOSIS — Z471 Aftercare following joint replacement surgery: Secondary | ICD-10-CM | POA: Diagnosis not present

## 2020-10-30 DIAGNOSIS — Z9181 History of falling: Secondary | ICD-10-CM | POA: Diagnosis not present

## 2020-10-31 ENCOUNTER — Ambulatory Visit (INDEPENDENT_AMBULATORY_CARE_PROVIDER_SITE_OTHER): Payer: Medicare HMO | Admitting: Orthopaedic Surgery

## 2020-10-31 ENCOUNTER — Other Ambulatory Visit: Payer: Self-pay

## 2020-10-31 ENCOUNTER — Encounter: Payer: Self-pay | Admitting: Orthopaedic Surgery

## 2020-10-31 DIAGNOSIS — Z96641 Presence of right artificial hip joint: Secondary | ICD-10-CM

## 2020-10-31 MED ORDER — HYDROCODONE-ACETAMINOPHEN 5-325 MG PO TABS: 1.0000 | ORAL_TABLET | Freq: Four times a day (QID) | ORAL | 0 refills | Status: DC | PRN

## 2020-10-31 NOTE — Progress Notes (Signed)
The patient is 2 weeks tomorrow status post a right total hip arthroplasty to treat severe osteoarthritis with some posttraumatic arthritis of the right hip.  He reports that he is making progress.  He is ambulate with a cane.  Home therapy has 1 more visit tomorrow.  Examination of his right hip shows the incision is healed nicely.  Remove the staples in place Steri-Strips.  I did aspirate about 50 cc of seroma from the area.  There is no evidence of infection.  His leg lengths are equal.  He will continue to slowly increase his mobility as comfort allows.  I will see him back in 4 weeks to see how he is doing overall.  All questions and concerns were answered and addressed.  I did refill his hydrocodone.

## 2020-11-01 DIAGNOSIS — D62 Acute posthemorrhagic anemia: Secondary | ICD-10-CM | POA: Diagnosis not present

## 2020-11-01 DIAGNOSIS — Z9181 History of falling: Secondary | ICD-10-CM | POA: Diagnosis not present

## 2020-11-01 DIAGNOSIS — Z471 Aftercare following joint replacement surgery: Secondary | ICD-10-CM | POA: Diagnosis not present

## 2020-11-01 DIAGNOSIS — Z8546 Personal history of malignant neoplasm of prostate: Secondary | ICD-10-CM | POA: Diagnosis not present

## 2020-11-01 DIAGNOSIS — Z96641 Presence of right artificial hip joint: Secondary | ICD-10-CM | POA: Diagnosis not present

## 2020-11-01 DIAGNOSIS — R69 Illness, unspecified: Secondary | ICD-10-CM | POA: Diagnosis not present

## 2020-11-05 ENCOUNTER — Telehealth: Payer: Self-pay | Admitting: Orthopaedic Surgery

## 2020-11-05 NOTE — Telephone Encounter (Signed)
Pt called stating Dr. Magnus Ivan saying he wanted him to be worked in the 29th

## 2020-11-06 NOTE — Telephone Encounter (Signed)
ok 

## 2020-11-06 NOTE — Telephone Encounter (Signed)
LMOM for patient letting him know we need him to come this morning, this afternoon is booked

## 2020-11-12 ENCOUNTER — Ambulatory Visit (INDEPENDENT_AMBULATORY_CARE_PROVIDER_SITE_OTHER): Payer: Medicare HMO | Admitting: Orthopaedic Surgery

## 2020-11-12 ENCOUNTER — Other Ambulatory Visit: Payer: Self-pay | Admitting: Orthopaedic Surgery

## 2020-11-12 ENCOUNTER — Encounter: Payer: Self-pay | Admitting: Orthopaedic Surgery

## 2020-11-12 DIAGNOSIS — Z96641 Presence of right artificial hip joint: Secondary | ICD-10-CM

## 2020-11-12 NOTE — Progress Notes (Signed)
The patient comes in today for early visit status post a right total hip arthroplasty we did on 10 December.  He has had a reaccumulation of his right hip seroma.  He is otherwise doing well.  On exam the incision looks good.  I was able to aspirate 70 to 80 cc of fluid off of the seroma.  He tolerated it well.  He will keep his next follow-up appointment with [place] on January 20.  If he does have a reaccumulation and would like this reaspirated he will let us know.

## 2020-11-22 ENCOUNTER — Other Ambulatory Visit: Payer: Self-pay | Admitting: Orthopaedic Surgery

## 2020-11-22 NOTE — Telephone Encounter (Signed)
Please advise 

## 2020-11-28 ENCOUNTER — Encounter: Payer: Self-pay | Admitting: Orthopaedic Surgery

## 2020-11-28 ENCOUNTER — Ambulatory Visit (INDEPENDENT_AMBULATORY_CARE_PROVIDER_SITE_OTHER): Payer: Medicare HMO | Admitting: Orthopaedic Surgery

## 2020-11-28 DIAGNOSIS — Z96641 Presence of right artificial hip joint: Secondary | ICD-10-CM

## 2020-11-28 NOTE — Progress Notes (Signed)
Austin Hopkins comes in today at 6-week status post a right total hip arthroplasty.  He still has a painful seroma and is hoping to have an aspiration from his hip.  He is doing well overall and denies any groin pain.  Examination of his right hip incision shows no evidence of infection.  I was able to aspirate about 35 cc of seroma fluid from the hip and this gave her some relief.  He understands that if it does reaccumulate he is welcome to call me and get work back in.  From my standpoint, I do not need to see him back for 6 months unless he is having issues.  At that visit I like a standing load with pelvis and a lateral of his right operative hip.

## 2021-01-13 ENCOUNTER — Other Ambulatory Visit: Payer: Self-pay | Admitting: Orthopaedic Surgery

## 2021-01-13 ENCOUNTER — Encounter: Payer: Self-pay | Admitting: Orthopaedic Surgery

## 2021-01-13 ENCOUNTER — Ambulatory Visit (INDEPENDENT_AMBULATORY_CARE_PROVIDER_SITE_OTHER): Payer: Medicare HMO | Admitting: Orthopaedic Surgery

## 2021-01-13 DIAGNOSIS — I83893 Varicose veins of bilateral lower extremities with other complications: Secondary | ICD-10-CM

## 2021-01-13 DIAGNOSIS — Z96641 Presence of right artificial hip joint: Secondary | ICD-10-CM

## 2021-01-13 NOTE — Progress Notes (Signed)
Austin Hopkins is now almost 3 months status post a right total hip arthroplasty.  He says the hip is doing well other than a painful incision.  He has been dealing more with bilateral lower extremity edema with concern of vascular issues.  This does run in his family.  He is concerned about skin discoloration and changes in his lower extremities as well as coolness.  Examination of both lower extremities shows he has been strong palpable dorsalis pedis and posterior tibial pulses bilaterally.  There is no evidence of cellulitis.  There is some varicosities of the veins in just dependent edema that I am seeing.  I gave him reassurance that this is not worrisome but he he would benefit from compression socks.  I did give him prescription for the socks that Dr. Sharol Given has developed because I think this will be the best for him.  I would like to see him back in 3 months to see how he is doing overall but at that visit I would like a standing low AP pelvis and lateral of his right operative hip.

## 2021-01-16 ENCOUNTER — Other Ambulatory Visit: Payer: Self-pay

## 2021-01-16 ENCOUNTER — Inpatient Hospital Stay (HOSPITAL_COMMUNITY)
Admission: EM | Admit: 2021-01-16 | Discharge: 2021-01-20 | DRG: 897 | Disposition: A | Discharge status: Home / Self Care | Payer: Medicare HMO | Attending: Internal Medicine | Admitting: Internal Medicine

## 2021-01-16 ENCOUNTER — Encounter (HOSPITAL_COMMUNITY): Payer: Self-pay | Admitting: Emergency Medicine

## 2021-01-16 DIAGNOSIS — S143XXS Injury of brachial plexus, sequela: Secondary | ICD-10-CM

## 2021-01-16 DIAGNOSIS — R9431 Abnormal electrocardiogram [ECG] [EKG]: Secondary | ICD-10-CM | POA: Diagnosis not present

## 2021-01-16 DIAGNOSIS — R7401 Elevation of levels of liver transaminase levels: Secondary | ICD-10-CM

## 2021-01-16 DIAGNOSIS — Z8546 Personal history of malignant neoplasm of prostate: Secondary | ICD-10-CM

## 2021-01-16 DIAGNOSIS — Z87891 Personal history of nicotine dependence: Secondary | ICD-10-CM

## 2021-01-16 DIAGNOSIS — F10939 Alcohol use, unspecified with withdrawal, unspecified: Secondary | ICD-10-CM | POA: Diagnosis present

## 2021-01-16 DIAGNOSIS — Z20822 Contact with and (suspected) exposure to covid-19: Secondary | ICD-10-CM | POA: Diagnosis present

## 2021-01-16 DIAGNOSIS — F10230 Alcohol dependence with withdrawal, uncomplicated: Secondary | ICD-10-CM | POA: Diagnosis not present

## 2021-01-16 DIAGNOSIS — Z79899 Other long term (current) drug therapy: Secondary | ICD-10-CM

## 2021-01-16 DIAGNOSIS — K7010 Alcoholic hepatitis without ascites: Secondary | ICD-10-CM | POA: Diagnosis present

## 2021-01-16 DIAGNOSIS — R7989 Other specified abnormal findings of blood chemistry: Secondary | ICD-10-CM | POA: Diagnosis present

## 2021-01-16 DIAGNOSIS — K760 Fatty (change of) liver, not elsewhere classified: Secondary | ICD-10-CM | POA: Diagnosis present

## 2021-01-16 DIAGNOSIS — Y900 Blood alcohol level of less than 20 mg/100 ml: Secondary | ICD-10-CM | POA: Diagnosis present

## 2021-01-16 DIAGNOSIS — E860 Dehydration: Secondary | ICD-10-CM | POA: Diagnosis present

## 2021-01-16 DIAGNOSIS — R69 Illness, unspecified: Secondary | ICD-10-CM | POA: Diagnosis not present

## 2021-01-16 DIAGNOSIS — G894 Chronic pain syndrome: Secondary | ICD-10-CM | POA: Diagnosis present

## 2021-01-16 DIAGNOSIS — Z96641 Presence of right artificial hip joint: Secondary | ICD-10-CM | POA: Diagnosis present

## 2021-01-16 DIAGNOSIS — F419 Anxiety disorder, unspecified: Secondary | ICD-10-CM | POA: Diagnosis present

## 2021-01-16 DIAGNOSIS — T148XXS Other injury of unspecified body region, sequela: Secondary | ICD-10-CM

## 2021-01-16 DIAGNOSIS — F10239 Alcohol dependence with withdrawal, unspecified: Secondary | ICD-10-CM | POA: Diagnosis not present

## 2021-01-16 DIAGNOSIS — Z7982 Long term (current) use of aspirin: Secondary | ICD-10-CM

## 2021-01-16 DIAGNOSIS — D696 Thrombocytopenia, unspecified: Secondary | ICD-10-CM | POA: Diagnosis present

## 2021-01-16 DIAGNOSIS — F32A Depression, unspecified: Secondary | ICD-10-CM | POA: Diagnosis present

## 2021-01-16 DIAGNOSIS — F10930 Alcohol use, unspecified with withdrawal, uncomplicated: Secondary | ICD-10-CM

## 2021-01-16 DIAGNOSIS — E876 Hypokalemia: Secondary | ICD-10-CM | POA: Diagnosis present

## 2021-01-16 HISTORY — DX: Alcohol dependence, uncomplicated: F10.20

## 2021-01-16 LAB — COMPREHENSIVE METABOLIC PANEL
ALT: 132 U/L — ABNORMAL HIGH (ref 0–44)
AST: 118 U/L — ABNORMAL HIGH (ref 15–41)
Albumin: 4.6 g/dL (ref 3.5–5.0)
Alkaline Phosphatase: 58 U/L (ref 38–126)
Anion gap: 20 — ABNORMAL HIGH (ref 5–15)
BUN: 13 mg/dL (ref 6–20)
CO2: 20 mmol/L — ABNORMAL LOW (ref 22–32)
Calcium: 9.9 mg/dL (ref 8.9–10.3)
Chloride: 93 mmol/L — ABNORMAL LOW (ref 98–111)
Creatinine, Ser: 1.14 mg/dL (ref 0.61–1.24)
GFR, Estimated: >60 mL/min (ref >60)
Glucose, Bld: 91 mg/dL (ref 70–99)
Potassium: 4.4 mmol/L (ref 3.5–5.1)
Sodium: 133 mmol/L — ABNORMAL LOW (ref 135–145)
Total Bilirubin: 2.1 mg/dL — ABNORMAL HIGH (ref 0.3–1.2)
Total Protein: 7.5 g/dL (ref 6.5–8.1)

## 2021-01-16 LAB — LIPASE, BLOOD: Lipase: 36 U/L (ref 11–51)

## 2021-01-16 LAB — CBC
HCT: 40 % (ref 39.0–52.0)
Hemoglobin: 13.5 g/dL (ref 13.0–17.0)
MCH: 35.3 pg — ABNORMAL HIGH (ref 26.0–34.0)
MCHC: 33.8 g/dL (ref 30.0–36.0)
MCV: 104.7 fL — ABNORMAL HIGH (ref 80.0–100.0)
Platelets: 161 10*3/uL (ref 150–400)
RBC: 3.82 MIL/uL — ABNORMAL LOW (ref 4.22–5.81)
RDW: 12.4 % (ref 11.5–15.5)
WBC: 6.5 10*3/uL (ref 4.0–10.5)
nRBC: 0 % (ref 0.0–0.2)

## 2021-01-16 LAB — MAGNESIUM: Magnesium: 1.7 mg/dL (ref 1.7–2.4)

## 2021-01-16 LAB — PROTIME-INR
INR: 1 (ref 0.8–1.2)
Prothrombin Time: 13.1 seconds (ref 11.4–15.2)

## 2021-01-16 LAB — RESP PANEL BY RT-PCR (FLU A&B, COVID) ARPGX2
Influenza A by PCR: NEGATIVE
Influenza B by PCR: NEGATIVE
SARS Coronavirus 2 by RT PCR: NEGATIVE

## 2021-01-16 LAB — PHOSPHORUS: Phosphorus: 2.3 mg/dL — ABNORMAL LOW (ref 2.5–4.6)

## 2021-01-16 LAB — RAPID URINE DRUG SCREEN, HOSP PERFORMED
Amphetamines: NOT DETECTED
Barbiturates: NOT DETECTED
Benzodiazepines: POSITIVE — AB
Cocaine: NOT DETECTED
Opiates: NOT DETECTED
Tetrahydrocannabinol: POSITIVE — AB

## 2021-01-16 LAB — ETHANOL: Alcohol, Ethyl (B): <10 mg/dL (ref <10)

## 2021-01-16 MED ORDER — ONDANSETRON HCL 4 MG/2ML IJ SOLN
4.0000 mg | Freq: Once | INTRAMUSCULAR | Status: AC
  Administered 2021-01-16: 4 mg via INTRAVENOUS
  Filled 2021-01-16: qty 2

## 2021-01-16 MED ORDER — LORAZEPAM 1 MG PO TABS
0.0000 mg | ORAL_TABLET | Freq: Four times a day (QID) | ORAL | Status: AC
  Administered 2021-01-16: 2 mg via ORAL
  Administered 2021-01-17: 1 mg via ORAL
  Administered 2021-01-17 (×2): 2 mg via ORAL
  Administered 2021-01-18: 1 mg via ORAL
  Filled 2021-01-16: qty 1
  Filled 2021-01-16 (×3): qty 2
  Filled 2021-01-16: qty 1

## 2021-01-16 MED ORDER — LORAZEPAM 2 MG/ML IJ SOLN: 0.0000 mg | Freq: Two times a day (BID) | INTRAMUSCULAR | Status: AC

## 2021-01-16 MED ORDER — ASPIRIN 81 MG PO CHEW: 81.0000 mg | CHEWABLE_TABLET | Freq: Two times a day (BID) | ORAL | Status: DC

## 2021-01-16 MED ORDER — POLYETHYLENE GLYCOL 3350 17 G PO PACK
17.0000 g | PACK | Freq: Every day | ORAL | Status: DC | PRN
Start: 2021-01-16 — End: 2021-01-20

## 2021-01-16 MED ORDER — ADULT MULTIVITAMIN W/MINERALS CH
1.0000 | ORAL_TABLET | Freq: Every day | ORAL | Status: DC
  Administered 2021-01-16 – 2021-01-19 (×4): 1 via ORAL
  Filled 2021-01-16 (×4): qty 1

## 2021-01-16 MED ORDER — LORAZEPAM 2 MG/ML IJ SOLN
0.0000 mg | Freq: Four times a day (QID) | INTRAMUSCULAR | Status: AC
  Administered 2021-01-16: 2 mg via INTRAVENOUS
  Administered 2021-01-16: 1 mg via INTRAVENOUS
  Filled 2021-01-16 (×3): qty 1

## 2021-01-16 MED ORDER — LACTATED RINGERS IV BOLUS
1000.0000 mL | Freq: Once | INTRAVENOUS | Status: AC
  Administered 2021-01-16: 1000 mL via INTRAVENOUS

## 2021-01-16 MED ORDER — THIAMINE HCL 100 MG PO TABS
100.0000 mg | ORAL_TABLET | Freq: Every day | ORAL | Status: DC
  Administered 2021-01-17 – 2021-01-19 (×3): 100 mg via ORAL
  Filled 2021-01-16 (×3): qty 1

## 2021-01-16 MED ORDER — LORAZEPAM 1 MG PO TABS
0.0000 mg | ORAL_TABLET | Freq: Two times a day (BID) | ORAL | Status: AC
  Administered 2021-01-18 – 2021-01-20 (×4): 1 mg via ORAL
  Filled 2021-01-16 (×4): qty 1

## 2021-01-16 MED ORDER — ENOXAPARIN SODIUM 40 MG/0.4ML ~~LOC~~ SOLN
40.0000 mg | SUBCUTANEOUS | Status: DC
  Administered 2021-01-16 – 2021-01-19 (×4): 40 mg via SUBCUTANEOUS
  Filled 2021-01-16 (×4): qty 0.4

## 2021-01-16 MED ORDER — ONDANSETRON HCL 4 MG PO TABS
4.0000 mg | ORAL_TABLET | Freq: Four times a day (QID) | ORAL | Status: DC | PRN
  Administered 2021-01-17 – 2021-01-19 (×2): 4 mg via ORAL
  Filled 2021-01-16 (×2): qty 1

## 2021-01-16 MED ORDER — IBUPROFEN 200 MG PO TABS
600.0000 mg | ORAL_TABLET | Freq: Once | ORAL | Status: AC
  Administered 2021-01-16: 600 mg via ORAL
  Filled 2021-01-16: qty 3

## 2021-01-16 MED ORDER — THIAMINE HCL 100 MG/ML IJ SOLN
100.0000 mg | Freq: Every day | INTRAMUSCULAR | Status: DC
  Administered 2021-01-16: 100 mg via INTRAVENOUS
  Filled 2021-01-16: qty 2

## 2021-01-16 MED ORDER — LORAZEPAM 1 MG PO TABS
1.0000 mg | ORAL_TABLET | ORAL | Status: AC | PRN
  Administered 2021-01-18 – 2021-01-19 (×5): 1 mg via ORAL
  Filled 2021-01-16 (×5): qty 1

## 2021-01-16 MED ORDER — LACTATED RINGERS IV SOLN: INTRAVENOUS | Status: AC

## 2021-01-16 MED ORDER — LORAZEPAM 2 MG/ML IJ SOLN: 1.0000 mg | INTRAMUSCULAR | Status: AC | PRN

## 2021-01-16 MED ORDER — FOLIC ACID 1 MG PO TABS
1.0000 mg | ORAL_TABLET | Freq: Every day | ORAL | Status: DC
  Administered 2021-01-16 – 2021-01-19 (×4): 1 mg via ORAL
  Filled 2021-01-16 (×4): qty 1

## 2021-01-16 MED ORDER — ONDANSETRON HCL 4 MG/2ML IJ SOLN
4.0000 mg | Freq: Four times a day (QID) | INTRAMUSCULAR | Status: DC | PRN
  Administered 2021-01-18: 4 mg via INTRAVENOUS
  Filled 2021-01-16: qty 2

## 2021-01-16 MED ORDER — METHOCARBAMOL 500 MG PO TABS
500.0000 mg | ORAL_TABLET | Freq: Four times a day (QID) | ORAL | Status: DC | PRN
  Administered 2021-01-16 – 2021-01-19 (×6): 500 mg via ORAL
  Filled 2021-01-16 (×6): qty 1
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
## 2021-01-16 NOTE — ED Notes (Signed)
Per Jarrett Soho, RN on 5W, they are ready to receive patient. Asked to document on the SBAR handoff.
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
## 2021-01-16 NOTE — ED Notes (Signed)
Patient aware we need urine sample, does not feel like he can give one at this time.  Urinal at bedside.

## 2021-01-17 ENCOUNTER — Observation Stay (HOSPITAL_COMMUNITY): Payer: Medicare HMO

## 2021-01-17 DIAGNOSIS — Z8546 Personal history of malignant neoplasm of prostate: Secondary | ICD-10-CM | POA: Diagnosis not present

## 2021-01-17 DIAGNOSIS — E860 Dehydration: Secondary | ICD-10-CM | POA: Diagnosis not present

## 2021-01-17 DIAGNOSIS — F10239 Alcohol dependence with withdrawal, unspecified: Secondary | ICD-10-CM | POA: Diagnosis not present

## 2021-01-17 DIAGNOSIS — Z96641 Presence of right artificial hip joint: Secondary | ICD-10-CM | POA: Diagnosis present

## 2021-01-17 DIAGNOSIS — Z7982 Long term (current) use of aspirin: Secondary | ICD-10-CM | POA: Diagnosis not present

## 2021-01-17 DIAGNOSIS — D696 Thrombocytopenia, unspecified: Secondary | ICD-10-CM | POA: Diagnosis present

## 2021-01-17 DIAGNOSIS — T148XXS Other injury of unspecified body region, sequela: Secondary | ICD-10-CM | POA: Diagnosis not present

## 2021-01-17 DIAGNOSIS — F32A Depression, unspecified: Secondary | ICD-10-CM | POA: Diagnosis present

## 2021-01-17 DIAGNOSIS — S143XXS Injury of brachial plexus, sequela: Secondary | ICD-10-CM | POA: Diagnosis not present

## 2021-01-17 DIAGNOSIS — Z20822 Contact with and (suspected) exposure to covid-19: Secondary | ICD-10-CM | POA: Diagnosis not present

## 2021-01-17 DIAGNOSIS — E876 Hypokalemia: Secondary | ICD-10-CM | POA: Diagnosis not present

## 2021-01-17 DIAGNOSIS — Y900 Blood alcohol level of less than 20 mg/100 ml: Secondary | ICD-10-CM | POA: Diagnosis not present

## 2021-01-17 DIAGNOSIS — R7989 Other specified abnormal findings of blood chemistry: Secondary | ICD-10-CM | POA: Diagnosis not present

## 2021-01-17 DIAGNOSIS — Z79899 Other long term (current) drug therapy: Secondary | ICD-10-CM | POA: Diagnosis not present

## 2021-01-17 DIAGNOSIS — Z87891 Personal history of nicotine dependence: Secondary | ICD-10-CM | POA: Diagnosis not present

## 2021-01-17 DIAGNOSIS — K760 Fatty (change of) liver, not elsewhere classified: Secondary | ICD-10-CM | POA: Diagnosis not present

## 2021-01-17 DIAGNOSIS — F10230 Alcohol dependence with withdrawal, uncomplicated: Secondary | ICD-10-CM | POA: Diagnosis present

## 2021-01-17 DIAGNOSIS — G894 Chronic pain syndrome: Secondary | ICD-10-CM | POA: Diagnosis not present

## 2021-01-17 DIAGNOSIS — F419 Anxiety disorder, unspecified: Secondary | ICD-10-CM | POA: Diagnosis present

## 2021-01-17 DIAGNOSIS — R69 Illness, unspecified: Secondary | ICD-10-CM | POA: Diagnosis not present

## 2021-01-17 LAB — COMPREHENSIVE METABOLIC PANEL
ALT: 99 U/L — ABNORMAL HIGH (ref 0–44)
AST: 82 U/L — ABNORMAL HIGH (ref 15–41)
Albumin: 3.8 g/dL (ref 3.5–5.0)
Alkaline Phosphatase: 46 U/L (ref 38–126)
Anion gap: 18 — ABNORMAL HIGH (ref 5–15)
BUN: 10 mg/dL (ref 6–20)
CO2: 19 mmol/L — ABNORMAL LOW (ref 22–32)
Calcium: 9.3 mg/dL (ref 8.9–10.3)
Chloride: 99 mmol/L (ref 98–111)
Creatinine, Ser: 0.93 mg/dL (ref 0.61–1.24)
GFR, Estimated: >60 mL/min (ref >60)
Glucose, Bld: 79 mg/dL (ref 70–99)
Potassium: 3.6 mmol/L (ref 3.5–5.1)
Sodium: 136 mmol/L (ref 135–145)
Total Bilirubin: 2.2 mg/dL — ABNORMAL HIGH (ref 0.3–1.2)
Total Protein: 6.2 g/dL — ABNORMAL LOW (ref 6.5–8.1)

## 2021-01-17 LAB — CBC
HCT: 35.3 % — ABNORMAL LOW (ref 39.0–52.0)
Hemoglobin: 11.9 g/dL — ABNORMAL LOW (ref 13.0–17.0)
MCH: 35.4 pg — ABNORMAL HIGH (ref 26.0–34.0)
MCHC: 33.7 g/dL (ref 30.0–36.0)
MCV: 105.1 fL — ABNORMAL HIGH (ref 80.0–100.0)
Platelets: 138 10*3/uL — ABNORMAL LOW (ref 150–400)
RBC: 3.36 MIL/uL — ABNORMAL LOW (ref 4.22–5.81)
RDW: 12.1 % (ref 11.5–15.5)
WBC: 6.1 10*3/uL (ref 4.0–10.5)
nRBC: 0 % (ref 0.0–0.2)

## 2021-01-17 LAB — PHOSPHORUS: Phosphorus: 2.7 mg/dL (ref 2.5–4.6)

## 2021-01-17 LAB — MAGNESIUM: Magnesium: 2 mg/dL (ref 1.7–2.4)

## 2021-01-17 LAB — HIV ANTIBODY (ROUTINE TESTING W REFLEX): HIV Screen 4th Generation wRfx: NONREACTIVE

## 2021-01-17 MED ORDER — DEXTROSE-NACL 5-0.45 % IV SOLN: INTRAVENOUS | Status: AC

## 2021-01-17 MED ORDER — PANTOPRAZOLE SODIUM 40 MG PO TBEC
40.0000 mg | DELAYED_RELEASE_TABLET | Freq: Every day | ORAL | Status: DC
  Administered 2021-01-17 – 2021-01-20 (×4): 40 mg via ORAL
  Filled 2021-01-17 (×5): qty 1
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
## 2021-01-18 DIAGNOSIS — E860 Dehydration: Secondary | ICD-10-CM | POA: Diagnosis not present

## 2021-01-18 DIAGNOSIS — F10230 Alcohol dependence with withdrawal, uncomplicated: Secondary | ICD-10-CM | POA: Diagnosis not present

## 2021-01-18 DIAGNOSIS — R7989 Other specified abnormal findings of blood chemistry: Secondary | ICD-10-CM | POA: Diagnosis not present

## 2021-01-18 LAB — COMPREHENSIVE METABOLIC PANEL
ALT: 76 U/L — ABNORMAL HIGH (ref 0–44)
AST: 56 U/L — ABNORMAL HIGH (ref 15–41)
Albumin: 3.3 g/dL — ABNORMAL LOW (ref 3.5–5.0)
Alkaline Phosphatase: 41 U/L (ref 38–126)
Anion gap: 10 (ref 5–15)
BUN: 7 mg/dL (ref 6–20)
CO2: 28 mmol/L (ref 22–32)
Calcium: 8.8 mg/dL — ABNORMAL LOW (ref 8.9–10.3)
Chloride: 97 mmol/L — ABNORMAL LOW (ref 98–111)
Creatinine, Ser: 0.79 mg/dL (ref 0.61–1.24)
GFR, Estimated: >60 mL/min (ref >60)
Glucose, Bld: 136 mg/dL — ABNORMAL HIGH (ref 70–99)
Potassium: 2.9 mmol/L — ABNORMAL LOW (ref 3.5–5.1)
Sodium: 135 mmol/L (ref 135–145)
Total Bilirubin: 1.1 mg/dL (ref 0.3–1.2)
Total Protein: 5.7 g/dL — ABNORMAL LOW (ref 6.5–8.1)

## 2021-01-18 LAB — CBC
HCT: 33.2 % — ABNORMAL LOW (ref 39.0–52.0)
Hemoglobin: 11.6 g/dL — ABNORMAL LOW (ref 13.0–17.0)
MCH: 35.7 pg — ABNORMAL HIGH (ref 26.0–34.0)
MCHC: 34.9 g/dL (ref 30.0–36.0)
MCV: 102.2 fL — ABNORMAL HIGH (ref 80.0–100.0)
Platelets: 131 10*3/uL — ABNORMAL LOW (ref 150–400)
RBC: 3.25 MIL/uL — ABNORMAL LOW (ref 4.22–5.81)
RDW: 11.9 % (ref 11.5–15.5)
WBC: 4.4 10*3/uL (ref 4.0–10.5)
nRBC: 0 % (ref 0.0–0.2)

## 2021-01-18 LAB — MAGNESIUM: Magnesium: 1.7 mg/dL (ref 1.7–2.4)

## 2021-01-18 MED ORDER — DEXTROSE-NACL 5-0.45 % IV SOLN: INTRAVENOUS | Status: AC

## 2021-01-18 MED ORDER — POTASSIUM CHLORIDE CRYS ER 20 MEQ PO TBCR
40.0000 meq | EXTENDED_RELEASE_TABLET | ORAL | Status: AC
Start: 2021-01-18 — End: 2021-01-18
  Administered 2021-01-18 (×2): 40 meq via ORAL
  Filled 2021-01-18 (×2): qty 2
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
## 2021-01-19 DIAGNOSIS — E860 Dehydration: Secondary | ICD-10-CM | POA: Diagnosis not present

## 2021-01-19 DIAGNOSIS — F10239 Alcohol dependence with withdrawal, unspecified: Secondary | ICD-10-CM

## 2021-01-19 DIAGNOSIS — R7989 Other specified abnormal findings of blood chemistry: Secondary | ICD-10-CM | POA: Diagnosis not present

## 2021-01-19 DIAGNOSIS — F10230 Alcohol dependence with withdrawal, uncomplicated: Secondary | ICD-10-CM | POA: Diagnosis not present

## 2021-01-19 LAB — COMPREHENSIVE METABOLIC PANEL
ALT: 78 U/L — ABNORMAL HIGH (ref 0–44)
AST: 59 U/L — ABNORMAL HIGH (ref 15–41)
Albumin: 3.3 g/dL — ABNORMAL LOW (ref 3.5–5.0)
Alkaline Phosphatase: 39 U/L (ref 38–126)
Anion gap: 9 (ref 5–15)
BUN: 5 mg/dL — ABNORMAL LOW (ref 6–20)
CO2: 25 mmol/L (ref 22–32)
Calcium: 8.8 mg/dL — ABNORMAL LOW (ref 8.9–10.3)
Chloride: 101 mmol/L (ref 98–111)
Creatinine, Ser: 0.77 mg/dL (ref 0.61–1.24)
GFR, Estimated: >60 mL/min (ref >60)
Glucose, Bld: 131 mg/dL — ABNORMAL HIGH (ref 70–99)
Potassium: 3.4 mmol/L — ABNORMAL LOW (ref 3.5–5.1)
Sodium: 135 mmol/L (ref 135–145)
Total Bilirubin: 0.9 mg/dL (ref 0.3–1.2)
Total Protein: 5.7 g/dL — ABNORMAL LOW (ref 6.5–8.1)

## 2021-01-19 LAB — HEPATITIS PANEL, ACUTE
HCV Ab: NONREACTIVE
Hep A IgM: NONREACTIVE
Hep B C IgM: NONREACTIVE
Hepatitis B Surface Ag: NONREACTIVE

## 2021-01-19 LAB — CBC
HCT: 34.7 % — ABNORMAL LOW (ref 39.0–52.0)
Hemoglobin: 11.8 g/dL — ABNORMAL LOW (ref 13.0–17.0)
MCH: 34.8 pg — ABNORMAL HIGH (ref 26.0–34.0)
MCHC: 34 g/dL (ref 30.0–36.0)
MCV: 102.4 fL — ABNORMAL HIGH (ref 80.0–100.0)
Platelets: 141 10*3/uL — ABNORMAL LOW (ref 150–400)
RBC: 3.39 MIL/uL — ABNORMAL LOW (ref 4.22–5.81)
RDW: 11.9 % (ref 11.5–15.5)
WBC: 5.1 10*3/uL (ref 4.0–10.5)
nRBC: 0 % (ref 0.0–0.2)

## 2021-01-19 MED ORDER — POTASSIUM CHLORIDE CRYS ER 20 MEQ PO TBCR
40.0000 meq | EXTENDED_RELEASE_TABLET | Freq: Once | ORAL | Status: AC
  Administered 2021-01-19: 40 meq via ORAL
  Filled 2021-01-19: qty 2

## 2021-01-19 MED ORDER — AMLODIPINE BESYLATE 5 MG PO TABS: 5.0000 mg | ORAL_TABLET | Freq: Every day | ORAL | Status: DC
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
## 2021-01-20 DIAGNOSIS — R7989 Other specified abnormal findings of blood chemistry: Secondary | ICD-10-CM | POA: Diagnosis not present

## 2021-01-20 DIAGNOSIS — F10230 Alcohol dependence with withdrawal, uncomplicated: Secondary | ICD-10-CM | POA: Diagnosis not present

## 2021-01-20 DIAGNOSIS — E860 Dehydration: Secondary | ICD-10-CM | POA: Diagnosis not present

## 2021-01-20 LAB — BASIC METABOLIC PANEL
Anion gap: 8 (ref 5–15)
BUN: <5 mg/dL — ABNORMAL LOW (ref 6–20)
CO2: 26 mmol/L (ref 22–32)
Calcium: 9.4 mg/dL (ref 8.9–10.3)
Chloride: 101 mmol/L (ref 98–111)
Creatinine, Ser: 0.75 mg/dL (ref 0.61–1.24)
GFR, Estimated: >60 mL/min (ref >60)
Glucose, Bld: 94 mg/dL (ref 70–99)
Potassium: 4.1 mmol/L (ref 3.5–5.1)
Sodium: 135 mmol/L (ref 135–145)

## 2021-01-20 LAB — CBC
HCT: 35.8 % — ABNORMAL LOW (ref 39.0–52.0)
Hemoglobin: 12.4 g/dL — ABNORMAL LOW (ref 13.0–17.0)
MCH: 35.1 pg — ABNORMAL HIGH (ref 26.0–34.0)
MCHC: 34.6 g/dL (ref 30.0–36.0)
MCV: 101.4 fL — ABNORMAL HIGH (ref 80.0–100.0)
Platelets: 152 10*3/uL (ref 150–400)
RBC: 3.53 MIL/uL — ABNORMAL LOW (ref 4.22–5.81)
RDW: 11.9 % (ref 11.5–15.5)
WBC: 4.9 10*3/uL (ref 4.0–10.5)
nRBC: 0 % (ref 0.0–0.2)

## 2021-01-20 LAB — MAGNESIUM: Magnesium: 1.8 mg/dL (ref 1.7–2.4)

## 2021-01-20 MED ORDER — ALPRAZOLAM 0.5 MG PO TABS: 0.5000 mg | ORAL_TABLET | Freq: Two times a day (BID) | ORAL | 0 refills | Status: AC | PRN

## 2021-01-20 MED ORDER — PANTOPRAZOLE SODIUM 40 MG PO TBEC: 40.0000 mg | DELAYED_RELEASE_TABLET | Freq: Every day | ORAL | 1 refills | Status: DC

## 2021-01-20 MED ORDER — THIAMINE HCL 100 MG PO TABS: 100.0000 mg | ORAL_TABLET | Freq: Every day | ORAL | 3 refills | Status: DC

## 2021-01-20 MED ORDER — CHLORDIAZEPOXIDE HCL 25 MG PO CAPS
25.0000 mg | ORAL_CAPSULE | Freq: Once | ORAL | Status: AC
  Administered 2021-01-20: 25 mg via ORAL
  Filled 2021-01-20: qty 1

## 2021-01-20 MED ORDER — FOLIC ACID 1 MG PO TABS: 1.0000 mg | ORAL_TABLET | Freq: Every day | ORAL | 3 refills | Status: DC
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
## 2021-01-21 DIAGNOSIS — I10 Essential (primary) hypertension: Secondary | ICD-10-CM | POA: Diagnosis not present

## 2021-01-21 DIAGNOSIS — R69 Illness, unspecified: Secondary | ICD-10-CM | POA: Diagnosis not present

## 2021-01-21 DIAGNOSIS — E871 Hypo-osmolality and hyponatremia: Secondary | ICD-10-CM | POA: Diagnosis not present

## 2021-01-21 DIAGNOSIS — F1120 Opioid dependence, uncomplicated: Secondary | ICD-10-CM | POA: Diagnosis not present

## 2021-01-21 DIAGNOSIS — E669 Obesity, unspecified: Secondary | ICD-10-CM | POA: Diagnosis not present

## 2021-01-21 DIAGNOSIS — R739 Hyperglycemia, unspecified: Secondary | ICD-10-CM | POA: Diagnosis not present

## 2021-01-21 DIAGNOSIS — I700 Atherosclerosis of aorta: Secondary | ICD-10-CM | POA: Diagnosis not present

## 2021-01-21 DIAGNOSIS — R945 Abnormal results of liver function studies: Secondary | ICD-10-CM | POA: Diagnosis not present

## 2021-01-21 DIAGNOSIS — E785 Hyperlipidemia, unspecified: Secondary | ICD-10-CM | POA: Diagnosis not present

## 2021-01-28 DIAGNOSIS — M25512 Pain in left shoulder: Secondary | ICD-10-CM | POA: Diagnosis not present

## 2021-02-13 DIAGNOSIS — R945 Abnormal results of liver function studies: Secondary | ICD-10-CM | POA: Diagnosis not present

## 2021-02-13 DIAGNOSIS — I10 Essential (primary) hypertension: Secondary | ICD-10-CM | POA: Diagnosis not present

## 2021-02-13 DIAGNOSIS — E669 Obesity, unspecified: Secondary | ICD-10-CM | POA: Diagnosis not present

## 2021-02-13 DIAGNOSIS — R739 Hyperglycemia, unspecified: Secondary | ICD-10-CM | POA: Diagnosis not present

## 2021-02-13 DIAGNOSIS — C61 Malignant neoplasm of prostate: Secondary | ICD-10-CM | POA: Diagnosis not present

## 2021-02-13 DIAGNOSIS — E785 Hyperlipidemia, unspecified: Secondary | ICD-10-CM | POA: Diagnosis not present

## 2021-02-13 DIAGNOSIS — R69 Illness, unspecified: Secondary | ICD-10-CM | POA: Diagnosis not present

## 2021-02-13 DIAGNOSIS — G540 Brachial plexus disorders: Secondary | ICD-10-CM | POA: Diagnosis not present

## 2021-02-13 DIAGNOSIS — G894 Chronic pain syndrome: Secondary | ICD-10-CM | POA: Diagnosis not present

## 2021-02-25 DIAGNOSIS — M79642 Pain in left hand: Secondary | ICD-10-CM | POA: Diagnosis not present

## 2021-03-03 ENCOUNTER — Ambulatory Visit (INDEPENDENT_AMBULATORY_CARE_PROVIDER_SITE_OTHER): Payer: Medicare HMO | Admitting: Orthopaedic Surgery

## 2021-03-03 ENCOUNTER — Encounter: Payer: Self-pay | Admitting: Orthopaedic Surgery

## 2021-03-03 DIAGNOSIS — M79672 Pain in left foot: Secondary | ICD-10-CM | POA: Diagnosis not present

## 2021-03-03 DIAGNOSIS — Z96641 Presence of right artificial hip joint: Secondary | ICD-10-CM

## 2021-03-03 MED ORDER — METHYLPREDNISOLONE 4 MG PO TABS: ORAL_TABLET | ORAL | 0 refills | Status: DC

## 2021-03-03 NOTE — Progress Notes (Signed)
Stormy Card is now about 4 months out from a right total hip arthroplasty.  The hip is doing well for him.  He has been hospitalized recently for alcohol abuse.  He is sober as of now and is doing well.  He looks much better overall which is his color and demeanor.  He says his LFTs have improved.  He is followed by Dr. Shon Baton of [facility].  He has been having some right hip pain at the operative site but no significant issues.  He is also dealt with some left foot pain he points to the second metatarsal area as a source of pain.  Is right operative hip moves smoothly and fluidly.  The right hip incisions healed nicely and there is no firmness to this area.  Examination of his left foot shows some pain over the second metatarsal head only.  At this point I would try a steroid taper to see if that helps his left foot as well as his right hip and left knee.  I did complement him home how he is now taking care of his health and how much better he looks.  He is also lost weight and he can tell.  From my standpoint I will see him back in 3 months with a standing low AP pelvis and lateral of his right operative hip.  If he continues to have problems with his left foot pain we would have him seen by Dr. Sharol Given.  All questions and concerns were answered and addressed.

## 2021-03-19 ENCOUNTER — Telehealth: Payer: Self-pay | Admitting: Orthopaedic Surgery

## 2021-03-19 NOTE — Telephone Encounter (Signed)
Patient aware he does not need pre-med

## 2021-03-19 NOTE — Telephone Encounter (Signed)
Patient called for pre meds for his dental appt tomorrow at 11 am. Please send antibiotics to pharmacy on file. Please call patient when called in at [phone].

## 2021-03-27 DIAGNOSIS — M7542 Impingement syndrome of left shoulder: Secondary | ICD-10-CM | POA: Diagnosis not present

## 2021-03-27 DIAGNOSIS — M7742 Metatarsalgia, left foot: Secondary | ICD-10-CM | POA: Diagnosis not present

## 2021-04-03 ENCOUNTER — Other Ambulatory Visit: Payer: Self-pay | Admitting: Orthopaedic Surgery

## 2021-04-16 ENCOUNTER — Encounter: Payer: Self-pay | Admitting: Orthopaedic Surgery

## 2021-04-16 ENCOUNTER — Ambulatory Visit (INDEPENDENT_AMBULATORY_CARE_PROVIDER_SITE_OTHER): Payer: Medicare HMO | Admitting: Orthopaedic Surgery

## 2021-04-16 ENCOUNTER — Ambulatory Visit (INDEPENDENT_AMBULATORY_CARE_PROVIDER_SITE_OTHER): Payer: Medicare HMO

## 2021-04-16 ENCOUNTER — Other Ambulatory Visit: Payer: Self-pay

## 2021-04-16 DIAGNOSIS — Z96641 Presence of right artificial hip joint: Secondary | ICD-10-CM

## 2021-04-16 NOTE — Progress Notes (Signed)
[name] comes in today 42-month status post a right total hip arthroplasty.  He said the right hip is doing very well for him.  He denies any left hip pain.  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  So far he is pain-free without left hip.  He reports better mobility with the right side and no significant discomfort with right hip.  I can put both hips through internal and external rotation with no significant discomfort at all.  NuPrep an AP pelvis and lateral of the right hip shows a well-maintained implant on the right side.  The left hip joint is still very well-maintained and he cannot see any significant change in the femoral head consistent with AVN that are seen on the CT scan.  At this point, we will see him back at his 1 year follow-up visit with a final AP pelvis only.  If there is any issues before then he will let us know.  All questions and concerns were answered and addressed.

## 2021-04-30 DIAGNOSIS — H5203 Hypermetropia, bilateral: Secondary | ICD-10-CM | POA: Diagnosis not present

## 2021-04-30 DIAGNOSIS — H52223 Regular astigmatism, bilateral: Secondary | ICD-10-CM | POA: Diagnosis not present

## 2021-04-30 DIAGNOSIS — H524 Presbyopia: Secondary | ICD-10-CM | POA: Diagnosis not present

## 2021-05-05 DIAGNOSIS — D692 Other nonthrombocytopenic purpura: Secondary | ICD-10-CM | POA: Diagnosis not present

## 2021-05-20 ENCOUNTER — Other Ambulatory Visit: Payer: Self-pay

## 2021-05-20 ENCOUNTER — Ambulatory Visit (INDEPENDENT_AMBULATORY_CARE_PROVIDER_SITE_OTHER): Payer: Medicare HMO | Admitting: Otolaryngology

## 2021-05-20 DIAGNOSIS — H6121 Impacted cerumen, right ear: Secondary | ICD-10-CM

## 2021-05-20 DIAGNOSIS — H9071 Mixed conductive and sensorineural hearing loss, unilateral, right ear, with unrestricted hearing on the contralateral side: Secondary | ICD-10-CM
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
## 2021-05-21 ENCOUNTER — Other Ambulatory Visit: Payer: Self-pay | Admitting: Orthopaedic Surgery

## 2021-05-21 MED ORDER — MUPIROCIN 2 % EX OINT: 1.0000 "application " | TOPICAL_OINTMENT | Freq: Every day | CUTANEOUS | 3 refills | Status: DC | PRN

## 2021-06-02 ENCOUNTER — Ambulatory Visit: Payer: Medicare HMO | Admitting: Orthopaedic Surgery

## 2021-08-18 DIAGNOSIS — Z125 Encounter for screening for malignant neoplasm of prostate: Secondary | ICD-10-CM | POA: Diagnosis not present

## 2021-08-18 DIAGNOSIS — E785 Hyperlipidemia, unspecified: Secondary | ICD-10-CM | POA: Diagnosis not present

## 2021-08-18 DIAGNOSIS — I10 Essential (primary) hypertension: Secondary | ICD-10-CM | POA: Diagnosis not present

## 2021-08-18 DIAGNOSIS — R739 Hyperglycemia, unspecified: Secondary | ICD-10-CM | POA: Diagnosis not present

## 2021-08-26 DIAGNOSIS — Z1331 Encounter for screening for depression: Secondary | ICD-10-CM | POA: Diagnosis not present

## 2021-08-26 DIAGNOSIS — E785 Hyperlipidemia, unspecified: Secondary | ICD-10-CM | POA: Diagnosis not present

## 2021-08-26 DIAGNOSIS — E871 Hypo-osmolality and hyponatremia: Secondary | ICD-10-CM | POA: Diagnosis not present

## 2021-08-26 DIAGNOSIS — E669 Obesity, unspecified: Secondary | ICD-10-CM | POA: Diagnosis not present

## 2021-08-26 DIAGNOSIS — Z0000 Encounter for general adult medical examination without abnormal findings: Secondary | ICD-10-CM | POA: Diagnosis not present

## 2021-08-26 DIAGNOSIS — R82998 Other abnormal findings in urine: Secondary | ICD-10-CM | POA: Diagnosis not present

## 2021-08-26 DIAGNOSIS — Z23 Encounter for immunization: Secondary | ICD-10-CM | POA: Diagnosis not present

## 2021-08-26 DIAGNOSIS — Z1339 Encounter for screening examination for other mental health and behavioral disorders: Secondary | ICD-10-CM | POA: Diagnosis not present

## 2021-08-26 DIAGNOSIS — G894 Chronic pain syndrome: Secondary | ICD-10-CM | POA: Diagnosis not present

## 2021-08-26 DIAGNOSIS — M1990 Unspecified osteoarthritis, unspecified site: Secondary | ICD-10-CM | POA: Diagnosis not present

## 2021-08-26 DIAGNOSIS — R69 Illness, unspecified: Secondary | ICD-10-CM | POA: Diagnosis not present

## 2021-08-26 DIAGNOSIS — I10 Essential (primary) hypertension: Secondary | ICD-10-CM | POA: Diagnosis not present

## 2021-08-26 DIAGNOSIS — R739 Hyperglycemia, unspecified: Secondary | ICD-10-CM | POA: Diagnosis not present

## 2021-08-26 DIAGNOSIS — R945 Abnormal results of liver function studies: Secondary | ICD-10-CM | POA: Diagnosis not present

## 2021-10-13 ENCOUNTER — Ambulatory Visit (INDEPENDENT_AMBULATORY_CARE_PROVIDER_SITE_OTHER): Payer: Medicare HMO

## 2021-10-13 ENCOUNTER — Telehealth: Payer: Self-pay

## 2021-10-13 ENCOUNTER — Ambulatory Visit (INDEPENDENT_AMBULATORY_CARE_PROVIDER_SITE_OTHER): Payer: Medicare HMO | Admitting: Orthopaedic Surgery

## 2021-10-13 ENCOUNTER — Other Ambulatory Visit: Payer: Self-pay

## 2021-10-13 DIAGNOSIS — M25562 Pain in left knee: Secondary | ICD-10-CM

## 2021-10-13 DIAGNOSIS — Z96641 Presence of right artificial hip joint: Secondary | ICD-10-CM

## 2021-10-13 DIAGNOSIS — G8929 Other chronic pain: Secondary | ICD-10-CM

## 2021-10-13 DIAGNOSIS — M1712 Unilateral primary osteoarthritis, left knee: Secondary | ICD-10-CM

## 2021-10-13 MED ORDER — LIDOCAINE HCL 1 % IJ SOLN
3.0000 mL | INTRAMUSCULAR | Status: AC | PRN
  Administered 2021-10-13: 3 mL

## 2021-10-13 MED ORDER — METHYLPREDNISOLONE ACETATE 40 MG/ML IJ SUSP
40.0000 mg | INTRAMUSCULAR | Status: AC | PRN
  Administered 2021-10-13: 40 mg via INTRA_ARTICULAR
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
## 2021-10-13 NOTE — Telephone Encounter (Signed)
Left knee gel injection ?

## 2021-10-14 NOTE — Telephone Encounter (Signed)
Noted  

## 2021-10-16 ENCOUNTER — Ambulatory Visit: Payer: Medicare HMO | Admitting: Orthopaedic Surgery

## 2021-10-20 ENCOUNTER — Telehealth: Payer: Self-pay

## 2021-10-20 NOTE — Telephone Encounter (Signed)
VOB submitted for SynviscOne, left knee. BV pending. 

## 2021-10-22 ENCOUNTER — Encounter: Payer: Self-pay | Admitting: Orthopaedic Surgery

## 2021-10-22 ENCOUNTER — Telehealth: Payer: Self-pay

## 2021-10-22 NOTE — Telephone Encounter (Signed)
PA required for SynviscOne, left knee. Faxed completed PA form to [facility] at [phone]. PA pending

## 2021-10-27 ENCOUNTER — Telehealth: Payer: Self-pay

## 2021-10-27 NOTE — Telephone Encounter (Signed)
Approved for SynviscOne, left knee. [place] Patient will be responsible for 20% OOP Co-pay of $25.00 PA Approval# [id number] Valid 10/22/2021- 01/20/2022  Appt. 11/12/2021

## 2021-10-30 DIAGNOSIS — S143XXD Injury of brachial plexus, subsequent encounter: Secondary | ICD-10-CM | POA: Diagnosis not present

## 2021-10-30 DIAGNOSIS — M79642 Pain in left hand: Secondary | ICD-10-CM | POA: Diagnosis not present

## 2021-10-30 DIAGNOSIS — S143XXA Injury of brachial plexus, initial encounter: Secondary | ICD-10-CM | POA: Diagnosis not present

## 2021-10-30 DIAGNOSIS — G5601 Carpal tunnel syndrome, right upper limb: Secondary | ICD-10-CM | POA: Diagnosis not present

## 2021-11-03 ENCOUNTER — Telehealth: Payer: Medicare HMO | Admitting: Nurse Practitioner

## 2021-11-03 DIAGNOSIS — J40 Bronchitis, not specified as acute or chronic: Secondary | ICD-10-CM

## 2021-11-03 MED ORDER — BENZONATATE 100 MG PO CAPS
100.0000 mg | ORAL_CAPSULE | Freq: Three times a day (TID) | ORAL | 0 refills | Status: DC | PRN
Start: 2021-11-03 — End: 2021-11-03

## 2021-11-03 MED ORDER — AZITHROMYCIN 250 MG PO TABS: ORAL_TABLET | ORAL | 0 refills | Status: AC

## 2021-11-03 MED ORDER — AZITHROMYCIN 250 MG PO TABS: ORAL_TABLET | ORAL | 0 refills | Status: DC

## 2021-11-03 MED ORDER — BENZONATATE 100 MG PO CAPS: 100.0000 mg | ORAL_CAPSULE | Freq: Three times a day (TID) | ORAL | 0 refills | Status: DC | PRN

## 2021-11-03 NOTE — Progress Notes (Signed)
We are sorry that you are not feeling well.  Here is how we plan to help!  Based on your presentation I believe you most likely have A cough due to bacteria.  When patients have a fever and a productive cough with a change in color or increased sputum production, we are concerned about bacterial bronchitis.  If left untreated it can progress to pneumonia.  If your symptoms do not improve with your treatment plan it is important that you contact your provider.   I have prescribed Azithromyin 250 mg: two tablets now and then one tablet daily for 4 additonal days    In addition you may use A prescription cough medication called Tessalon Perles 100mg . You may take 1-2 capsules every 8 hours as needed for your cough.    From your responses in the eVisit questionnaire you describe inflammation in the upper respiratory tract which is causing a significant cough.  This is commonly called Bronchitis and has four common causes:   Allergies Viral Infections Acid Reflux Bacterial Infection Allergies, viruses and acid reflux are treated by controlling symptoms or eliminating the cause. An example might be a cough caused by taking certain blood pressure medications. You stop the cough by changing the medication. Another example might be a cough caused by acid reflux. Controlling the reflux helps control the cough.     HOME CARE Only take medications as instructed by your medical team. Complete the entire course of an antibiotic. Drink plenty of fluids and get plenty of rest. Avoid close contacts especially the very young and the elderly Cover your mouth if you cough or cough into your sleeve. Always remember to wash your hands A steam or ultrasonic humidifier can help congestion.   GET HELP RIGHT AWAY IF: You develop worsening fever. You become short of breath You cough up blood. Your symptoms persist after you have completed your treatment plan MAKE SURE YOU  Understand these instructions. Will  watch your condition. Will get help right away if you are not doing well or get worse.    Thank you for choosing an e-visit.  Your e-visit answers were reviewed by a board certified advanced clinical practitioner to complete your personal care plan. Depending upon the condition, your plan could have included both over the counter or prescription medications.  Please review your pharmacy choice. Make sure the pharmacy is open so you can pick up prescription now. If there is a problem, you may contact your provider through [employer] and have the prescription routed to another pharmacy.  Your safety is important to [place]. If you have drug allergies check your prescription carefully.   For the next 24 hours you can use MyChart to ask questions about today's visit, request a non-urgent call back, or ask for a work or school excuse. You will get an email in the next two days asking about your experience. I hope that your e-visit has been valuable and will speed your recovery.   I spent approximately 7 minutes reviewing the patient's history, current symptoms and coordinating their plan of care today.    Meds ordered this encounter  Medications   azithromycin (ZITHROMAX) 250 MG tablet    Sig: Take 2 tablets on day 1, then 1 tablet daily on days 2 through 5    Dispense:  6 tablet    Refill:  0   benzonatate (TESSALON) 100 MG capsule    Sig: Take 1 capsule (100 mg total) by mouth 3 (three) times  daily as needed for cough.    Dispense:  30 capsule    Refill:  0

## 2021-11-03 NOTE — Addendum Note (Signed)
Addended by: Apolonio Schneiders E on: 11/03/2021 02:40 PM   Modules accepted: Orders

## 2021-11-09 ENCOUNTER — Telehealth: Payer: Medicare HMO | Admitting: Emergency Medicine

## 2021-11-09 ENCOUNTER — Encounter: Payer: Self-pay | Admitting: Emergency Medicine

## 2021-11-09 DIAGNOSIS — J040 Acute laryngitis: Secondary | ICD-10-CM

## 2021-11-09 MED ORDER — PREDNISONE 10 MG (21) PO TBPK: ORAL_TABLET | Freq: Every day | ORAL | 0 refills | Status: AC

## 2021-11-09 MED ORDER — PREDNISONE 10 MG (21) PO TBPK: ORAL_TABLET | Freq: Every day | ORAL | 0 refills | Status: DC

## 2021-11-09 NOTE — Addendum Note (Signed)
Addended by: Lestine Box on: 11/09/2021 12:12 PM   Modules accepted: Orders

## 2021-11-09 NOTE — Progress Notes (Signed)
°  E-Visit for Sore Throat  We are sorry that you are not feeling well.  Here is how we plan to help!  Your symptoms indicate a likely viral infection (laryngitis).   Laryngitis is inflammation of the larynx (voice box).   It is common to experience hoarseness.  Please keep in mind that your symptoms could last up to 10 days.  I will prescribe a prednisone taper to help with the inflammation as well as your symptoms. Avoid close contact with loved ones, especially the very young and elderly.  Remember to wash your hands thoroughly throughout the day as this is the number one way to prevent the spread of infection and wipe down door knobs and counters with disinfectant.  After careful review of your answers, I would not recommend an antibiotic for your condition.  Antibiotics should not be used to treat conditions that we suspect are caused by viruses like the virus that causes the common cold or flu. However, some people can have Strep with atypical symptoms. You may need formal testing in clinic or office to confirm if your symptoms continue or worsen.  Providers prescribe antibiotics to treat infections caused by bacteria. Antibiotics are very powerful in treating bacterial infections when they are used properly.  To maintain their effectiveness, they should be used only when necessary.  Overuse of antibiotics has resulted in the development of super bugs that are resistant to treatment!    Home Care: Only take medications as instructed by your medical team. Do not drink alcohol while taking these medications. A steam or ultrasonic humidifier can help congestion.  You can place a towel over your head and breathe in the steam from hot water coming from a faucet. Avoid close contacts especially the very young and the elderly. Cover your mouth when you cough or sneeze. Always remember to wash your hands.  Get Help Right Away If: You develop worsening fever or throat pain. You develop a severe head  ache or visual changes. Your symptoms persist after you have completed your treatment plan.  Make sure you Understand these instructions. Will watch your condition. Will get help right away if you are not doing well or get worse.   Thank you for choosing an e-visit.  Your e-visit answers were reviewed by a board certified advanced clinical practitioner to complete your personal care plan. Depending upon the condition, your plan could have included both over the counter or prescription medications.  Please review your pharmacy choice. Make sure the pharmacy is open so you can pick up prescription now. If there is a problem, you may contact your provider through [employer] and have the prescription routed to another pharmacy.  Your safety is important to [place]. If you have drug allergies check your prescription carefully.   For the next 24 hours you can use MyChart to ask questions about today's visit, request a non-urgent call back, or ask for a work or school excuse. You will get an email in the next two days asking about your experience. I hope that your e-visit has been valuable and will speed your recovery.

## 2021-11-09 NOTE — Progress Notes (Signed)
I have spent 5 minutes in review of e-visit questionnaire, review and updating patient chart, medical decision making and response to patient.   Laural Eiland, PA-C    

## 2021-11-12 ENCOUNTER — Ambulatory Visit: Payer: Medicare HMO | Admitting: Orthopaedic Surgery

## 2021-11-12 ENCOUNTER — Encounter: Payer: Self-pay | Admitting: Orthopaedic Surgery

## 2021-11-12 DIAGNOSIS — M1712 Unilateral primary osteoarthritis, left knee: Secondary | ICD-10-CM

## 2021-11-12 MED ORDER — HYLAN G-F 20 48 MG/6ML IX SOSY
48.0000 mg | PREFILLED_SYRINGE | INTRA_ARTICULAR | Status: AC | PRN
  Administered 2021-11-12: 48 mg via INTRA_ARTICULAR

## 2021-11-12 NOTE — Progress Notes (Signed)
° °  Procedure Note  Patient: Austin Hopkins             Date of Birth: May 12, 1961           MRN: [id number]             Visit Date: 11/12/2021 HPI: Stormy Card comes in today for scheduled Synvisc 1 injection left knee.  He has known osteoarthritis of the left knee.  He is failed conservative treatment which is included knee brace anti-inflammatories and cortisone injection.  He has no scheduled lab knee surgery in the next 6 months.  Physical exam: Left knee no abnormal warmth erythema or effusion.  Overall good range of motion of the knee.  Procedures: Visit Diagnoses:  1. Unilateral primary osteoarthritis, left knee     Large Joint Inj: L knee on 11/12/2021 10:08 AM Indications: pain Details: 22 G 1.5 in needle, superolateral approach  Arthrogram: No  Medications: 48 mg Hylan 48 MG/6ML Outcome: tolerated well, no immediate complications Procedure, treatment alternatives, risks and benefits explained, specific risks discussed. Consent was given by the patient. Immediately prior to procedure a time out was called to verify the correct patient, procedure, equipment, support staff and site/side marked as required. Patient was prepped and draped in the usual sterile fashion.    Plan: He understands to wait at least 6 months between supplemental injections.  Patient tolerated injection well today.  Follow-up as needed

## 2021-11-27 ENCOUNTER — Telehealth: Payer: Medicare HMO | Admitting: Family Medicine

## 2021-11-27 DIAGNOSIS — R052 Subacute cough: Secondary | ICD-10-CM

## 2021-11-27 NOTE — Progress Notes (Signed)
Austin Hopkins  Needs to be seen in person for on going symptoms and limited improvement from two previous treatments in last 30 days.   Message send to patient

## 2021-12-08 IMAGING — RF DG C-ARM 1-60 MIN-NO REPORT
1 series · 4 of 4 positions shown · non-contrast
Comparison: Right hip radiographs 08/21/2020

CLINICAL DATA: Right hip replacement.

EXAM:
OPERATIVE right HIP (WITH PELVIS IF PERFORMED) 2 VIEWS
TECHNIQUE: Fluoroscopic spot image(s) were submitted for interpretation
post-operatively.

[Series 1: unknown protocol · 0.20mm/px · 4 of 4 slices shown]
[im 1/4]
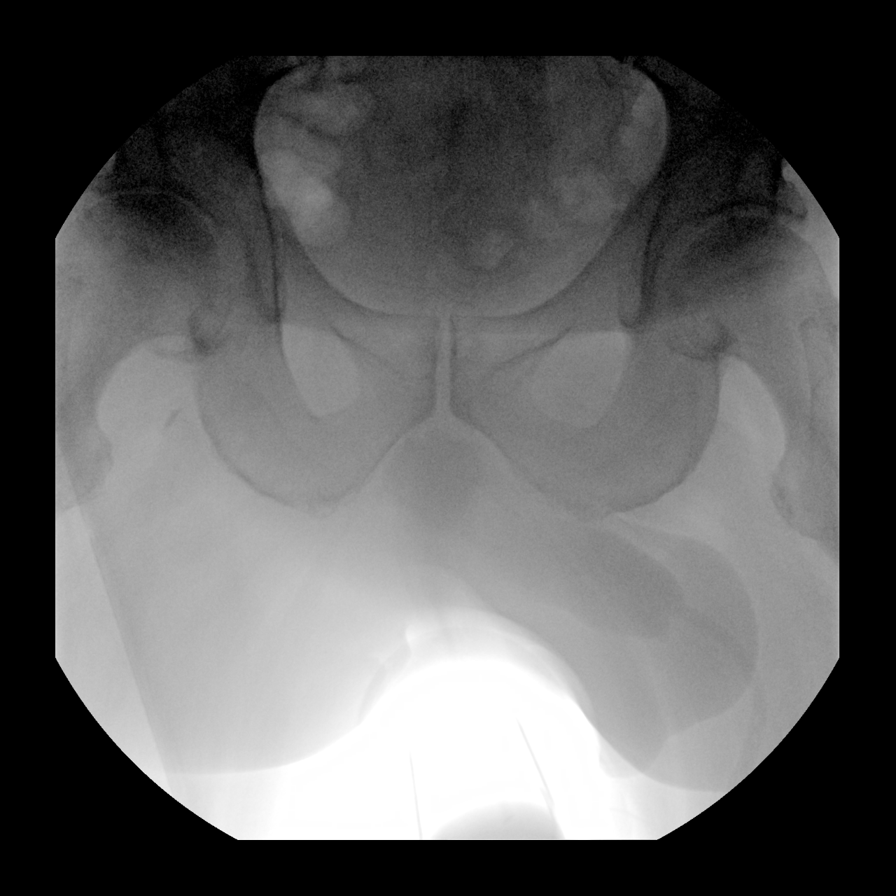
[im 2/4]
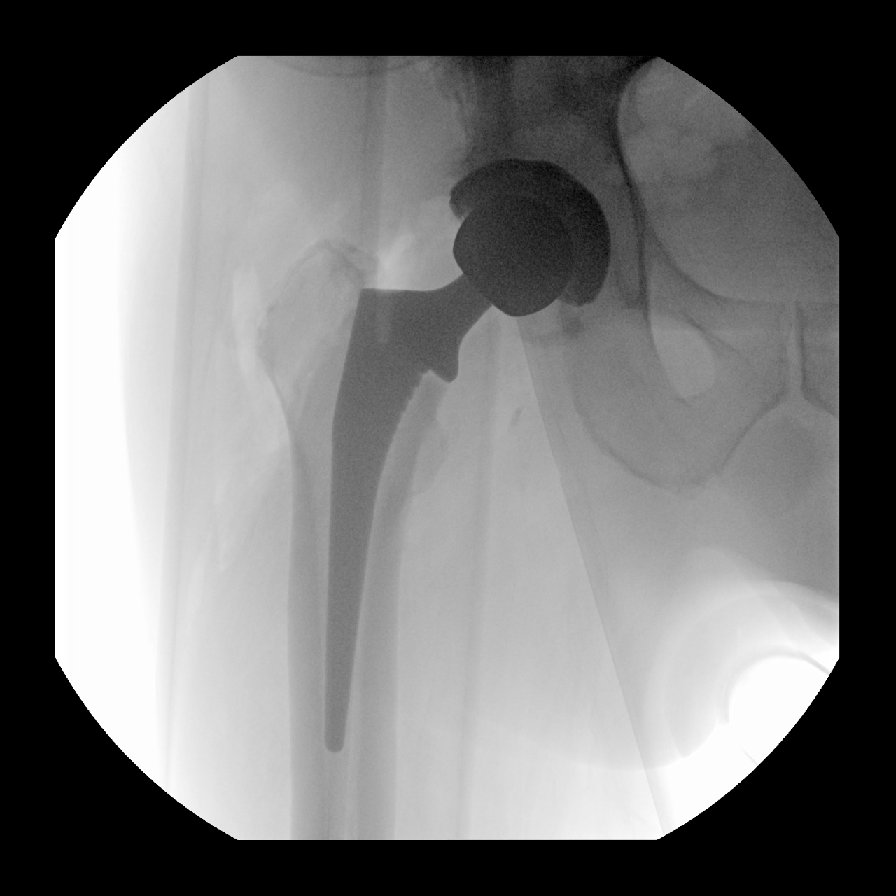
[im 3/4]
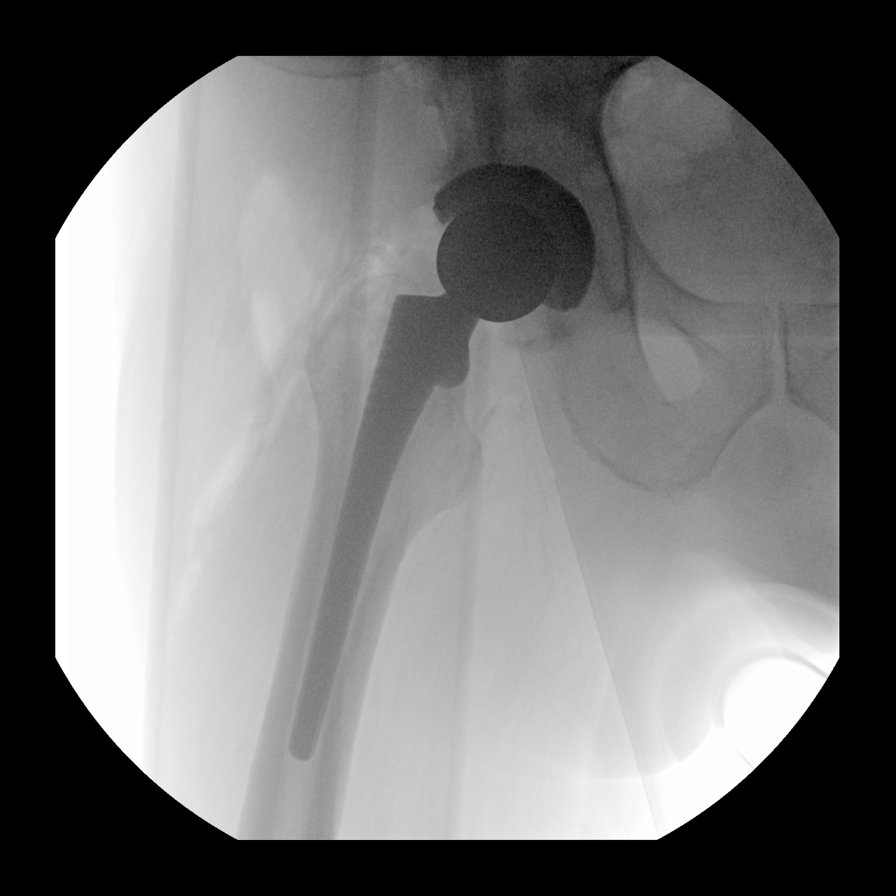
[im 4/4]
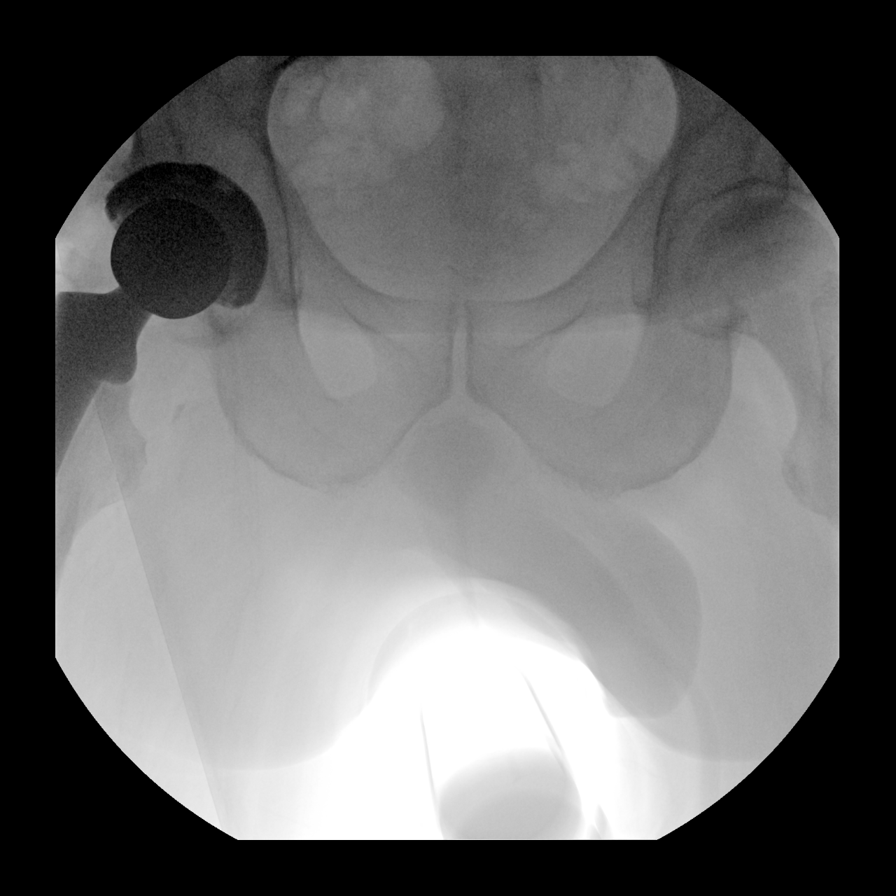

[4 of 4 positions shown; findings below may reference images not displayed]

FINDINGS: Internal and externally rotated views are submitted from the
operating room. Right total hip arthroplasty is noted. The
prosthesis is located. No acute fractures are present.
IMPRESSION: Right total hip arthroplasty without radiographic evidence for
complication.

## 2021-12-08 IMAGING — DX DG PORTABLE PELVIS
1 series · 1 of 1 positions shown · non-contrast
Comparison: Prior study same day.

CLINICAL DATA: Right hip arthroplasty.

EXAM:
PORTABLE PELVIS 1-2 VIEWS

[pelvis ap]
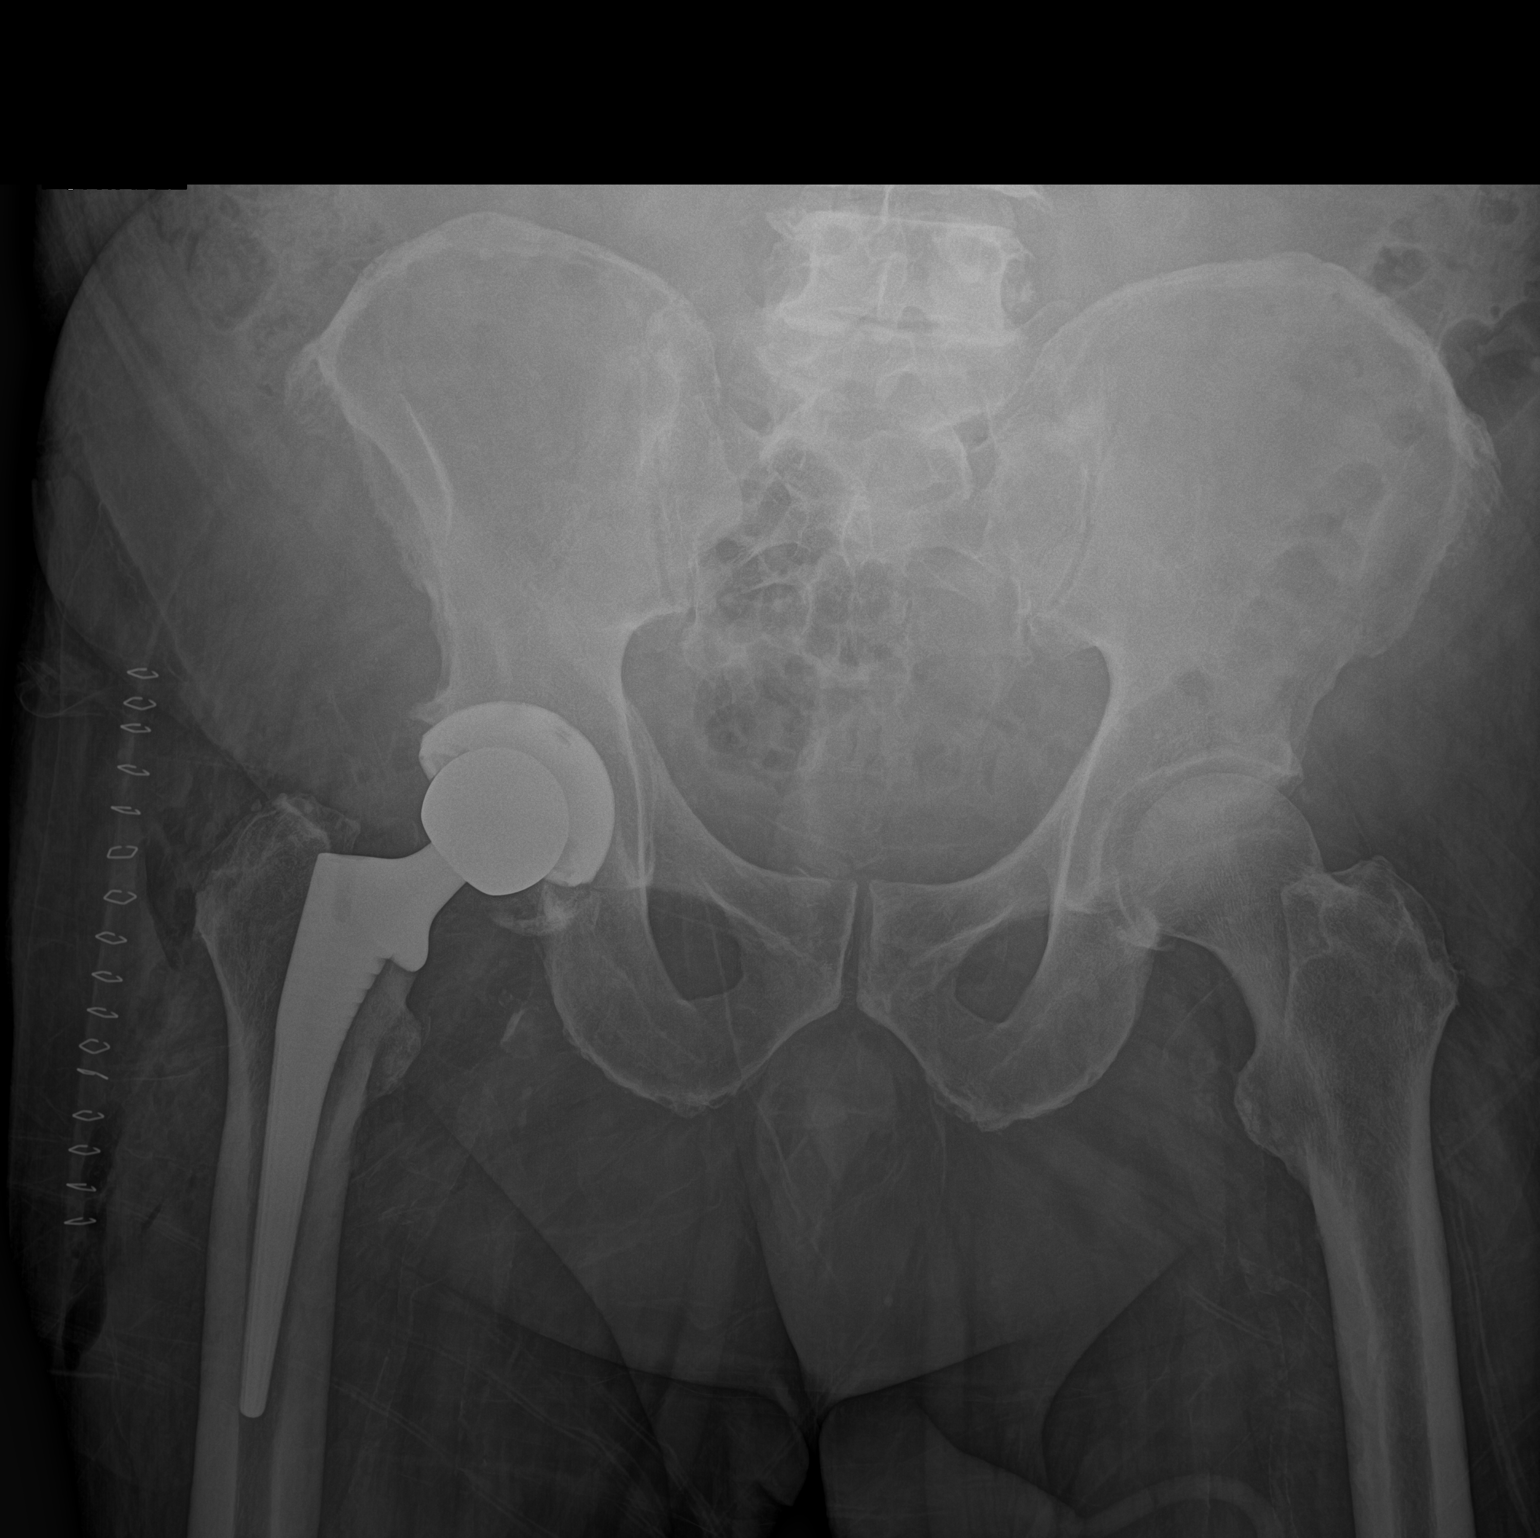

[1 of 1 positions shown; findings below may reference images not displayed]

FINDINGS: Total right hip replacement. Hardware intact. Anatomic alignment. No
acute bony abnormality. Degenerative changes lumbar spine and left
hip. Peripheral vascular calcification.
IMPRESSION: 1. Total right hip replacement with anatomic alignment.
2. Peripheral vascular disease.

## 2021-12-15 ENCOUNTER — Other Ambulatory Visit: Payer: Self-pay

## 2021-12-15 ENCOUNTER — Encounter: Payer: Self-pay | Admitting: Orthopaedic Surgery

## 2021-12-15 ENCOUNTER — Ambulatory Visit (INDEPENDENT_AMBULATORY_CARE_PROVIDER_SITE_OTHER): Payer: Medicare HMO | Admitting: Orthopaedic Surgery

## 2021-12-15 DIAGNOSIS — M1712 Unilateral primary osteoarthritis, left knee: Secondary | ICD-10-CM | POA: Diagnosis not present

## 2021-12-15 NOTE — Progress Notes (Signed)
Austin Hopkins comes in today for continued follow-up as it relates to his severely arthritic left knee.  We have tried steroid injections and aspirations.  He has more recently had hyaluronic acid for the left knee.  That has not helped.  His left knee pain is 10 out of 10 and it is daily.  It is definitely affecting his activities of daily living, his quality of life and his mobility.  He is interested in knee replacement surgery at this standpoint.  Even his posture is greatly affected and he is leaning over trying to offload his left knee.  We actually replaced his right hip about a year or so ago.  He has reaccumulated effusion with his left knee and I did aspirate about 30 cc of fluid which was clear off of the left knee.  I then should have a knee replacement model and went over in detail what the surgery involves.  We discussed the risks and benefits of surgery and talked about what to expect from intraoperative and postoperative course.  He is still doing otherwise well from a health standpoint and has stopped alcohol consumption.  He currently denies any headache, chest pain, shortness of breath, fever, chills, nausea, vomiting.  He is well aware of our recommendation for knee replacement surgery and he agrees with this as well and wishes to proceed given the severity of his pain and the failure of conservative treatment for well over a year now as it relates to his left knee.  All questions and concerns were answered and addressed.  We will work on getting him scheduled for left knee replacement.

## 2021-12-24 ENCOUNTER — Other Ambulatory Visit: Payer: Self-pay

## 2022-01-28 ENCOUNTER — Ambulatory Visit: Payer: Medicare HMO | Admitting: Orthopaedic Surgery

## 2022-01-28 ENCOUNTER — Encounter: Payer: Self-pay | Admitting: Orthopaedic Surgery

## 2022-01-28 VITALS — Ht 69.0 in | Wt 247.2 lb

## 2022-01-28 DIAGNOSIS — G8929 Other chronic pain: Secondary | ICD-10-CM | POA: Diagnosis not present

## 2022-01-28 DIAGNOSIS — M25562 Pain in left knee: Secondary | ICD-10-CM | POA: Diagnosis not present

## 2022-01-28 DIAGNOSIS — M1712 Unilateral primary osteoarthritis, left knee: Secondary | ICD-10-CM

## 2022-01-28 MED ORDER — LIDOCAINE HCL 1 % IJ SOLN
3.0000 mL | INTRAMUSCULAR | Status: AC | PRN
  Administered 2022-01-28: 3 mL

## 2022-01-28 MED ORDER — METHYLPREDNISOLONE ACETATE 40 MG/ML IJ SUSP
40.0000 mg | INTRAMUSCULAR | Status: AC | PRN
  Administered 2022-01-28: 40 mg via INTRA_ARTICULAR
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
## 2022-02-02 ENCOUNTER — Encounter: Payer: Self-pay | Admitting: Orthopaedic Surgery

## 2022-02-05 DIAGNOSIS — E669 Obesity, unspecified: Secondary | ICD-10-CM | POA: Diagnosis not present

## 2022-02-05 DIAGNOSIS — E785 Hyperlipidemia, unspecified: Secondary | ICD-10-CM | POA: Diagnosis not present

## 2022-02-05 DIAGNOSIS — M1990 Unspecified osteoarthritis, unspecified site: Secondary | ICD-10-CM | POA: Diagnosis not present

## 2022-02-05 DIAGNOSIS — R945 Abnormal results of liver function studies: Secondary | ICD-10-CM | POA: Diagnosis not present

## 2022-02-05 DIAGNOSIS — I10 Essential (primary) hypertension: Secondary | ICD-10-CM | POA: Diagnosis not present

## 2022-02-05 DIAGNOSIS — R69 Illness, unspecified: Secondary | ICD-10-CM | POA: Diagnosis not present

## 2022-02-05 DIAGNOSIS — R739 Hyperglycemia, unspecified: Secondary | ICD-10-CM | POA: Diagnosis not present

## 2022-02-05 DIAGNOSIS — H61301 Acquired stenosis of right external ear canal, unspecified: Secondary | ICD-10-CM | POA: Diagnosis not present

## 2022-02-05 DIAGNOSIS — G4700 Insomnia, unspecified: Secondary | ICD-10-CM | POA: Diagnosis not present

## 2022-02-23 ENCOUNTER — Encounter: Payer: Self-pay | Admitting: Orthopaedic Surgery

## 2022-02-26 ENCOUNTER — Encounter: Payer: Medicare HMO | Admitting: Orthopaedic Surgery

## 2022-03-05 ENCOUNTER — Telehealth: Payer: Self-pay | Admitting: *Deleted

## 2022-03-05 NOTE — Telephone Encounter (Signed)
RNCM attempted to call patient to discuss options related to caregiver after surgery, HH criteria and authorization as well as post op care. Left VM requesting call back. ?

## 2022-04-12 ENCOUNTER — Other Ambulatory Visit: Payer: Self-pay | Admitting: Orthopaedic Surgery

## 2022-04-12 MED ORDER — DOXYCYCLINE HYCLATE 100 MG PO TABS: 100.0000 mg | ORAL_TABLET | Freq: Two times a day (BID) | ORAL | 0 refills | Status: DC

## 2022-04-15 ENCOUNTER — Other Ambulatory Visit: Payer: Self-pay | Admitting: Physician Assistant

## 2022-04-15 DIAGNOSIS — M1712 Unilateral primary osteoarthritis, left knee: Secondary | ICD-10-CM

## 2022-04-17 NOTE — Progress Notes (Signed)
Anesthesia Review:  PCP: Cardiologist : Chest x-ray : EKG : Echo : Stress test: Cardiac Cath :  Activity level:  Sleep Study/ CPAP : Fasting Blood Sugar :      / Checks Blood Sugar -- times a day:   Blood Thinner/ Instructions /Last Dose: ASA / Instructions/ Last Dose :  

## 2022-04-17 NOTE — Progress Notes (Signed)
DUE TO COVID-19 ONLY  2 VISITOR IS ALLOWED TO COME WITH YOU AND STAY IN THE WAITING ROOM ONLY DURING PRE OP AND PROCEDURE DAY OF SURGERY.  4  VISITOR  MAY VISIT WITH YOU AFTER SURGERY IN YOUR PRIVATE ROOM DURING VISITING HOURS ONLY! YOU MAY HAVE ONE PERSON SPEND THE NITE WITH YOU IN YOUR ROOM AFTER SURGERY.    G.    Your procedure is scheduled on:       04/24/2022   Report to [facility] Main  Entrance   Report to admitting at        0530am           AM DO NOT BRING INSURANCE CARD, PICTURE ID OR WALLET DAY OF SURGERY.      Call this number if you have problems the morning of surgery [phone]    REMEMBER: NO  SOLID FOODS , CANDY, GUM OR MINTS AFTER [place] .       [place] Kitchen CLEAR LIQUIDS UNTIL       0515am          DAY OF SURGERY.      PLEASE FINISH ENSURE DRINK PER SURGEON ORDER  WHICH NEEDS TO BE COMPLETED AT     0515am       MORNING OF SURGERY.       CLEAR LIQUID DIET   Foods Allowed      WATER BLACK COFFEE ( SUGAR OK, NO MILK, CREAM OR CREAMER) REGULAR AND DECAF  TEA ( SUGAR OK NO MILK, CREAM, OR CREAMER) REGULAR AND DECAF  PLAIN JELLO ( NO RED)  FRUIT ICES ( NO RED, NO FRUIT PULP)  POPSICLES ( NO RED)  JUICE- APPLE, WHITE GRAPE AND WHITE CRANBERRY  SPORT DRINK LIKE GATORADE ( NO RED)  CLEAR BROTH ( VEGETABLE , CHICKEN OR BEEF)                                                                     BRUSH YOUR TEETH MORNING OF SURGERY AND RINSE YOUR MOUTH OUT, NO CHEWING GUM CANDY OR MINTS.     Take these medicines the morning of surgery with A SIP OF WATER: protonix    DO NOT TAKE ANY DIABETIC MEDICATIONS DAY OF YOUR SURGERY                               You may not have any metal on your body including hair pins and              piercings  Do not wear jewelry, make-up, lotions, powders or perfumes, deodorant             Do not wear nail polish on your fingernails.              IF YOU ARE A MALE AND WANT TO SHAVE UNDER ARMS OR LEGS PRIOR TO  SURGERY YOU MUST DO SO AT LEAST 48 HOURS PRIOR TO SURGERY.              Men may shave face and neck.   Do not bring valuables to the hospital. [facility] IS NOT  RESPONSIBLE   FOR VALUABLES.  Contacts, dentures or bridgework may not be worn into surgery.  Leave suitcase in the car. After surgery it may be brought to your room.     Patients discharged the day of surgery will not be allowed to drive home. IF YOU ARE HAVING SURGERY AND GOING HOME THE SAME DAY, YOU MUST HAVE AN ADULT TO DRIVE YOU HOME AND BE WITH YOU FOR 24 HOURS. YOU MAY GO HOME BY TAXI OR UBER OR ORTHERWISE, BUT AN ADULT MUST ACCOMPANY YOU HOME AND STAY WITH YOU FOR 24 HOURS.                Please read over the following fact sheets you were given: _____________________________________________________________________  [facility] - Preparing for Surgery Before surgery, you can play an important role.  Because skin is not sterile, your skin needs to be as free of germs as possible.  You can reduce the number of germs on your skin by washing with CHG (chlorahexidine gluconate) soap before surgery.  CHG is an antiseptic cleaner which kills germs and bonds with the skin to continue killing germs even after washing. Please DO NOT use if you have an allergy to CHG or antibacterial soaps.  If your skin becomes reddened/irritated stop using the CHG and inform your nurse when you arrive at Short Stay. Do not shave (including legs and underarms) for at least 48 hours prior to the first CHG shower.  You may shave your face/neck. Please follow these instructions carefully:  1.  Shower with CHG Soap the night before surgery and the  morning of Surgery.  2.  If you choose to wash your hair, wash your hair first as usual with your  normal  shampoo.  3.  After you shampoo, rinse your hair and body thoroughly to remove the  shampoo.                           4.  Use CHG as you would any other liquid soap.  You can apply chg directly   to the skin and wash                       Gently with a scrungie or clean washcloth.  5.  Apply the CHG Soap to your body ONLY FROM THE NECK DOWN.   Do not use on face/ open                           Wound or open sores. Avoid contact with eyes, ears mouth and genitals (private parts).                       Wash face,  Genitals (private parts) with your normal soap.             6.  Wash thoroughly, paying special attention to the area where your surgery  will be performed.  7.  Thoroughly rinse your body with warm water from the neck down.  8.  DO NOT shower/wash with your normal soap after using and rinsing off  the CHG Soap.                9.  Pat yourself dry with a clean towel.            10.  Wear clean pajamas.  11.  Place clean sheets on your bed the night of your first shower and do not  sleep with pets. Day of Surgery : Do not apply any lotions/deodorants the morning of surgery.  Please wear clean clothes to the hospital/surgery center.  FAILURE TO FOLLOW THESE INSTRUCTIONS MAY RESULT IN THE CANCELLATION OF YOUR SURGERY PATIENT SIGNATURE_________________________________  NURSE SIGNATURE__________________________________  ________________________________________________________________________

## 2022-04-20 ENCOUNTER — Other Ambulatory Visit: Payer: Self-pay

## 2022-04-20 ENCOUNTER — Encounter (HOSPITAL_COMMUNITY)
Admission: RE | Admit: 2022-04-20 | Discharge: 2022-04-20 | Disposition: A | Discharge status: Home / Self Care | Payer: Medicare HMO | Source: Ambulatory Visit | Attending: Orthopaedic Surgery | Admitting: Orthopaedic Surgery

## 2022-04-20 ENCOUNTER — Encounter (HOSPITAL_COMMUNITY): Payer: Self-pay

## 2022-04-20 DIAGNOSIS — Z01818 Encounter for other preprocedural examination: Secondary | ICD-10-CM | POA: Insufficient documentation

## 2022-04-20 DIAGNOSIS — M1712 Unilateral primary osteoarthritis, left knee: Secondary | ICD-10-CM | POA: Insufficient documentation

## 2022-04-20 LAB — BASIC METABOLIC PANEL
Anion gap: 11 (ref 5–15)
BUN: 16 mg/dL (ref 8–23)
CO2: 26 mmol/L (ref 22–32)
Calcium: 9.5 mg/dL (ref 8.9–10.3)
Chloride: 101 mmol/L (ref 98–111)
Creatinine, Ser: 0.95 mg/dL (ref 0.61–1.24)
GFR, Estimated: >60 mL/min (ref >60)
Glucose, Bld: 91 mg/dL (ref 70–99)
Potassium: 3.8 mmol/L (ref 3.5–5.1)
Sodium: 138 mmol/L (ref 135–145)

## 2022-04-20 LAB — CBC
HCT: 48.2 % (ref 39.0–52.0)
Hemoglobin: 16.3 g/dL (ref 13.0–17.0)
MCH: 36.7 pg — ABNORMAL HIGH (ref 26.0–34.0)
MCHC: 33.8 g/dL (ref 30.0–36.0)
MCV: 108.6 fL — ABNORMAL HIGH (ref 80.0–100.0)
Platelets: 193 10*3/uL (ref 150–400)
RBC: 4.44 MIL/uL (ref 4.22–5.81)
RDW: 13.7 % (ref 11.5–15.5)
WBC: 7.8 10*3/uL (ref 4.0–10.5)
nRBC: 0 % (ref 0.0–0.2)

## 2022-04-20 LAB — SURGICAL PCR SCREEN
MRSA, PCR: NEGATIVE
Staphylococcus aureus: NEGATIVE

## 2022-04-21 ENCOUNTER — Ambulatory Visit: Payer: Medicare HMO | Admitting: Orthopaedic Surgery

## 2022-04-21 ENCOUNTER — Encounter (HOSPITAL_COMMUNITY): Payer: Self-pay | Admitting: Physician Assistant

## 2022-04-21 ENCOUNTER — Telehealth: Payer: Self-pay | Admitting: Orthopaedic Surgery

## 2022-04-21 DIAGNOSIS — M1712 Unilateral primary osteoarthritis, left knee: Secondary | ICD-10-CM

## 2022-04-21 DIAGNOSIS — G8929 Other chronic pain: Secondary | ICD-10-CM

## 2022-04-21 DIAGNOSIS — M25562 Pain in left knee: Secondary | ICD-10-CM

## 2022-04-21 MED ORDER — SILVER SULFADIAZINE 1 % EX CREA: TOPICAL_CREAM | CUTANEOUS | 0 refills | Status: AC

## 2022-04-21 NOTE — Telephone Encounter (Signed)
Pt was seen by Dr Ninfa Linden today and need to be seen in 1 wk due to infection. Please open slot and call pt with appt time and date. Pt phone number is [phone]

## 2022-04-21 NOTE — Progress Notes (Signed)
Austin Hopkins comes in today for a wound check as a relates to his leg on the right leg.  He also sustained what he felt was red ant bites on his left leg.  He is scheduled for a left total knee arthroplasty this coming Friday.  He has been on antibiotics and has been soaking his legs.  He feels like his left leg is starting to burn and hurt more and that is the leg in which we are operating on this Friday.  Examination of both his legs shows some worrisome findings in terms of some red blotches.  There is a healing wound on his right leg but the fact that he is feeling worse on his left leg is concerning enough to warrant canceling his knee replacement for this Friday.  We are going to try Silvadene cream as well as continued soaks and some compression socks.  We would then see him back in follow-up to make sure the wounds are healing enough to safely schedule knee replacement.  He understands this as well.

## 2022-04-21 NOTE — Telephone Encounter (Signed)
Called and scheduled f/u.

## 2022-04-24 ENCOUNTER — Ambulatory Visit (HOSPITAL_COMMUNITY): Admission: RE | Admit: 2022-04-24 | Payer: Medicare HMO | Source: Home / Self Care | Admitting: Orthopaedic Surgery

## 2022-04-24 ENCOUNTER — Encounter (HOSPITAL_COMMUNITY): Admission: RE | Payer: Self-pay | Source: Home / Self Care

## 2022-04-24 DIAGNOSIS — Z01818 Encounter for other preprocedural examination: Secondary | ICD-10-CM

## 2022-04-24 LAB — TYPE AND SCREEN
ABO/RH(D): O POS
Antibody Screen: NEGATIVE

## 2022-04-24 SURGERY — ARTHROPLASTY, KNEE, TOTAL
Anesthesia: Choice | Site: Knee | Laterality: Left

## 2022-04-27 ENCOUNTER — Ambulatory Visit: Payer: Medicare HMO | Admitting: Orthopaedic Surgery

## 2022-04-30 ENCOUNTER — Encounter: Payer: Self-pay | Admitting: Physician Assistant

## 2022-04-30 ENCOUNTER — Ambulatory Visit: Payer: Medicare HMO | Admitting: Physician Assistant

## 2022-04-30 VITALS — Ht 69.0 in | Wt 241.0 lb

## 2022-04-30 DIAGNOSIS — M1712 Unilateral primary osteoarthritis, left knee: Secondary | ICD-10-CM

## 2022-04-30 NOTE — Progress Notes (Signed)
HPI: Austin Hopkins returns today for wound from rash right lower leg.  He states he is still trying to heal the wounds.  He is having burning in both legs.  He is also asking about having his left knee aspirated.  He had no new injury left knee he has chronic pain in the left knee and has known osteoarthritis in the knee.  He was scheduled for a left total knee replacement but due to the rash on the left leg we have held off on surgery.  Review of systems: See HPI.  Physical exam: General well-developed well-nourished male no acute distress ambulates without any assistive device.  Left knee plus minus effusion positive edema.  Attempted aspiration yielded dry tap. Right lower leg rash still present.  There is no drainage or weeping wounds in the right lower leg.  He has a dependent rubor of both legs.  Impression: Left knee osteoarthritis Right lower leg rash  Plan: He will continue current care did you get the piece of Xeroform which she will place over the right lower leg rash for 2 days and leave it in place.  For now go back to using Silvadene cream and washing th wound with antibacterial soap.  He also uses compression hose.  See him back in 3 weeks see how he is doing overall.  Questions encouraged and answered.

## 2022-05-07 ENCOUNTER — Encounter: Payer: Medicare HMO | Admitting: Orthopaedic Surgery

## 2022-05-21 ENCOUNTER — Encounter: Payer: Self-pay | Admitting: Orthopaedic Surgery

## 2022-05-21 ENCOUNTER — Ambulatory Visit: Payer: Medicare HMO | Admitting: Orthopaedic Surgery

## 2022-05-21 DIAGNOSIS — M1712 Unilateral primary osteoarthritis, left knee: Secondary | ICD-10-CM | POA: Diagnosis not present

## 2022-05-21 DIAGNOSIS — M25562 Pain in left knee: Secondary | ICD-10-CM

## 2022-05-21 DIAGNOSIS — G8929 Other chronic pain: Secondary | ICD-10-CM

## 2022-05-21 NOTE — Progress Notes (Signed)
Austin Hopkins comes in today for continued follow-up as it relates to the severe arthritis of his left knee.  We had to cancel surgery recently due to a skin wound on his right leg and just skin changes in general.  He has since been working on rehabbing his left knee.  He spends a lot of time at [employer] and he medical the physical therapist to was able to work with him from her water aerobics standpoint.  He is lost weight and he has good strength in his knee.  He said a neurologist felt that he may have dealt with shingles and now that is subsided.  On examination today he gets up out of a chair easier.  His skin wounds of healed on both legs and his legs look great.  He still has mild effusion of his left knee but his range of motion is improved and overall he looks better.  Since he has made some progress with weight loss and quad strengthening as well as water aerobics, he wants to hold off on a left knee replacement for a little longer.  I agree since he is doing better overall.  He does have severe arthritis in that left knee and when he gets to the point that conservative treatment measures of failed he knows he can call us and we will get him on the schedule once again for left knee replacement.  All question concerns were answered and addressed.

## 2022-07-11 ENCOUNTER — Telehealth: Payer: Medicare HMO | Admitting: Physician Assistant

## 2022-07-11 DIAGNOSIS — L0390 Cellulitis, unspecified: Secondary | ICD-10-CM

## 2022-07-11 NOTE — Progress Notes (Signed)
Because of the pain and ulcerations that you are experiencing, I feel your condition warrants further evaluation and I recommend that you be seen in a face to face visit.   NOTE: There will be NO CHARGE for this eVisit   If you are having a true medical emergency please call 911.      For an urgent face to face visit, [facility] has seven urgent care centers for your convenience:     [facility] [place] [facility] [place] Get Driving Directions [phone] [address] [place], [place] [address]    [facility]) Get Driving Directions [phone] [place], [place] [address]  [facility] [place] ([place]) Get Driving Directions [phone] [address] [place],  [place]  [address]  [facility] [place] [facility] at [address] Get Driving Directions  [phone] [address] [place],  [place] [address]   [facility] Get Driving Directions [phone] [address] [place], [place], [place] [address]   [facility] Get Driving Directions  [phone] [address], [place] [address]   [facility] [place] Get Driving Directions [phone] [address], [place], [place] [address]  Your MyChart E-visit questionnaire answers were reviewed by a board certified advanced clinical practitioner to complete your personal care plan based on your specific symptoms.  Thank you for using e-Visits.  Inda Coke PA-C

## 2022-07-12 ENCOUNTER — Other Ambulatory Visit: Payer: Self-pay | Admitting: Orthopaedic Surgery

## 2022-07-12 MED ORDER — DOXYCYCLINE HYCLATE 100 MG PO TABS: 100.0000 mg | ORAL_TABLET | Freq: Two times a day (BID) | ORAL | 0 refills | Status: AC

## 2022-07-20 DIAGNOSIS — M25562 Pain in left knee: Secondary | ICD-10-CM | POA: Diagnosis not present

## 2022-07-20 DIAGNOSIS — L97909 Non-pressure chronic ulcer of unspecified part of unspecified lower leg with unspecified severity: Secondary | ICD-10-CM | POA: Diagnosis not present

## 2022-07-20 DIAGNOSIS — M25561 Pain in right knee: Secondary | ICD-10-CM | POA: Diagnosis not present

## 2022-07-20 DIAGNOSIS — M170 Bilateral primary osteoarthritis of knee: Secondary | ICD-10-CM | POA: Diagnosis not present

## 2022-08-13 DIAGNOSIS — H6691 Otitis media, unspecified, right ear: Secondary | ICD-10-CM | POA: Diagnosis not present

## 2022-08-13 DIAGNOSIS — Z1329 Encounter for screening for other suspected endocrine disorder: Secondary | ICD-10-CM | POA: Diagnosis not present

## 2022-08-13 DIAGNOSIS — Z23 Encounter for immunization: Secondary | ICD-10-CM | POA: Diagnosis not present

## 2022-08-13 DIAGNOSIS — R030 Elevated blood-pressure reading, without diagnosis of hypertension: Secondary | ICD-10-CM | POA: Diagnosis not present

## 2022-08-13 DIAGNOSIS — Z1321 Encounter for screening for nutritional disorder: Secondary | ICD-10-CM | POA: Diagnosis not present

## 2022-08-13 DIAGNOSIS — Z7689 Persons encountering health services in other specified circumstances: Secondary | ICD-10-CM | POA: Diagnosis not present

## 2022-08-13 DIAGNOSIS — Z131 Encounter for screening for diabetes mellitus: Secondary | ICD-10-CM | POA: Diagnosis not present

## 2022-08-13 DIAGNOSIS — Z125 Encounter for screening for malignant neoplasm of prostate: Secondary | ICD-10-CM | POA: Diagnosis not present

## 2022-08-13 DIAGNOSIS — Z13220 Encounter for screening for lipoid disorders: Secondary | ICD-10-CM | POA: Diagnosis not present

## 2022-08-13 DIAGNOSIS — Z6832 Body mass index (BMI) 32.0-32.9, adult: Secondary | ICD-10-CM | POA: Diagnosis not present

## 2022-08-13 DIAGNOSIS — T148XXA Other injury of unspecified body region, initial encounter: Secondary | ICD-10-CM | POA: Diagnosis not present

## 2022-08-21 DIAGNOSIS — L97812 Non-pressure chronic ulcer of other part of right lower leg with fat layer exposed: Secondary | ICD-10-CM | POA: Diagnosis not present

## 2022-08-21 DIAGNOSIS — L97822 Non-pressure chronic ulcer of other part of left lower leg with fat layer exposed: Secondary | ICD-10-CM | POA: Diagnosis not present

## 2022-08-21 DIAGNOSIS — Z8546 Personal history of malignant neoplasm of prostate: Secondary | ICD-10-CM | POA: Diagnosis not present

## 2022-08-21 DIAGNOSIS — Z79899 Other long term (current) drug therapy: Secondary | ICD-10-CM | POA: Diagnosis not present

## 2022-08-21 DIAGNOSIS — T8189XD Other complications of procedures, not elsewhere classified, subsequent encounter: Secondary | ICD-10-CM | POA: Diagnosis not present

## 2022-08-24 DIAGNOSIS — L97812 Non-pressure chronic ulcer of other part of right lower leg with fat layer exposed: Secondary | ICD-10-CM | POA: Diagnosis not present

## 2022-08-24 DIAGNOSIS — L97822 Non-pressure chronic ulcer of other part of left lower leg with fat layer exposed: Secondary | ICD-10-CM | POA: Diagnosis not present

## 2022-08-28 DIAGNOSIS — L97822 Non-pressure chronic ulcer of other part of left lower leg with fat layer exposed: Secondary | ICD-10-CM | POA: Diagnosis not present

## 2022-08-28 DIAGNOSIS — L97812 Non-pressure chronic ulcer of other part of right lower leg with fat layer exposed: Secondary | ICD-10-CM | POA: Diagnosis not present

## 2022-08-28 DIAGNOSIS — Z8546 Personal history of malignant neoplasm of prostate: Secondary | ICD-10-CM | POA: Diagnosis not present

## 2022-08-28 DIAGNOSIS — Z79899 Other long term (current) drug therapy: Secondary | ICD-10-CM | POA: Diagnosis not present

## 2022-09-04 DIAGNOSIS — L97812 Non-pressure chronic ulcer of other part of right lower leg with fat layer exposed: Secondary | ICD-10-CM | POA: Diagnosis not present

## 2022-09-04 DIAGNOSIS — L97822 Non-pressure chronic ulcer of other part of left lower leg with fat layer exposed: Secondary | ICD-10-CM | POA: Diagnosis not present

## 2022-09-04 DIAGNOSIS — B957 Other staphylococcus as the cause of diseases classified elsewhere: Secondary | ICD-10-CM | POA: Diagnosis not present

## 2022-09-04 DIAGNOSIS — Z8546 Personal history of malignant neoplasm of prostate: Secondary | ICD-10-CM | POA: Diagnosis not present

## 2022-09-04 DIAGNOSIS — Z79899 Other long term (current) drug therapy: Secondary | ICD-10-CM | POA: Diagnosis not present

## 2022-09-11 DIAGNOSIS — L97822 Non-pressure chronic ulcer of other part of left lower leg with fat layer exposed: Secondary | ICD-10-CM | POA: Diagnosis not present

## 2022-09-11 DIAGNOSIS — B957 Other staphylococcus as the cause of diseases classified elsewhere: Secondary | ICD-10-CM | POA: Diagnosis not present

## 2022-09-11 DIAGNOSIS — L97812 Non-pressure chronic ulcer of other part of right lower leg with fat layer exposed: Secondary | ICD-10-CM | POA: Diagnosis not present

## 2022-09-18 DIAGNOSIS — B957 Other staphylococcus as the cause of diseases classified elsewhere: Secondary | ICD-10-CM | POA: Diagnosis not present

## 2022-09-18 DIAGNOSIS — L97822 Non-pressure chronic ulcer of other part of left lower leg with fat layer exposed: Secondary | ICD-10-CM | POA: Diagnosis not present

## 2022-09-18 DIAGNOSIS — L97812 Non-pressure chronic ulcer of other part of right lower leg with fat layer exposed: Secondary | ICD-10-CM | POA: Diagnosis not present

## 2022-09-25 DIAGNOSIS — L97822 Non-pressure chronic ulcer of other part of left lower leg with fat layer exposed: Secondary | ICD-10-CM | POA: Diagnosis not present

## 2022-09-25 DIAGNOSIS — L97812 Non-pressure chronic ulcer of other part of right lower leg with fat layer exposed: Secondary | ICD-10-CM | POA: Diagnosis not present

## 2022-09-28 DIAGNOSIS — S81809D Unspecified open wound, unspecified lower leg, subsequent encounter: Secondary | ICD-10-CM | POA: Diagnosis not present

## 2022-09-28 DIAGNOSIS — L97812 Non-pressure chronic ulcer of other part of right lower leg with fat layer exposed: Secondary | ICD-10-CM | POA: Diagnosis not present

## 2022-10-09 DIAGNOSIS — F064 Anxiety disorder due to known physiological condition: Secondary | ICD-10-CM | POA: Diagnosis not present

## 2022-10-09 DIAGNOSIS — I798 Other disorders of arteries, arterioles and capillaries in diseases classified elsewhere: Secondary | ICD-10-CM | POA: Diagnosis not present

## 2022-10-09 DIAGNOSIS — L97822 Non-pressure chronic ulcer of other part of left lower leg with fat layer exposed: Secondary | ICD-10-CM | POA: Diagnosis not present

## 2022-10-09 DIAGNOSIS — L97812 Non-pressure chronic ulcer of other part of right lower leg with fat layer exposed: Secondary | ICD-10-CM | POA: Diagnosis not present

## 2022-10-09 DIAGNOSIS — R69 Illness, unspecified: Secondary | ICD-10-CM | POA: Diagnosis not present

## 2022-10-13 DIAGNOSIS — I798 Other disorders of arteries, arterioles and capillaries in diseases classified elsewhere: Secondary | ICD-10-CM | POA: Diagnosis not present

## 2022-10-16 DIAGNOSIS — L97822 Non-pressure chronic ulcer of other part of left lower leg with fat layer exposed: Secondary | ICD-10-CM | POA: Diagnosis not present

## 2022-10-16 DIAGNOSIS — L97812 Non-pressure chronic ulcer of other part of right lower leg with fat layer exposed: Secondary | ICD-10-CM | POA: Diagnosis not present

## 2022-10-16 DIAGNOSIS — R69 Illness, unspecified: Secondary | ICD-10-CM | POA: Diagnosis not present

## 2022-10-16 DIAGNOSIS — F064 Anxiety disorder due to known physiological condition: Secondary | ICD-10-CM | POA: Diagnosis not present

## 2022-10-16 DIAGNOSIS — I798 Other disorders of arteries, arterioles and capillaries in diseases classified elsewhere: Secondary | ICD-10-CM | POA: Diagnosis not present

## 2022-10-23 DIAGNOSIS — L97822 Non-pressure chronic ulcer of other part of left lower leg with fat layer exposed: Secondary | ICD-10-CM | POA: Diagnosis not present

## 2022-10-23 DIAGNOSIS — F064 Anxiety disorder due to known physiological condition: Secondary | ICD-10-CM | POA: Diagnosis not present

## 2022-10-23 DIAGNOSIS — R69 Illness, unspecified: Secondary | ICD-10-CM | POA: Diagnosis not present

## 2022-10-23 DIAGNOSIS — I798 Other disorders of arteries, arterioles and capillaries in diseases classified elsewhere: Secondary | ICD-10-CM | POA: Diagnosis not present

## 2022-10-23 DIAGNOSIS — L97812 Non-pressure chronic ulcer of other part of right lower leg with fat layer exposed: Secondary | ICD-10-CM | POA: Diagnosis not present

## 2022-10-30 DIAGNOSIS — L97812 Non-pressure chronic ulcer of other part of right lower leg with fat layer exposed: Secondary | ICD-10-CM | POA: Diagnosis not present

## 2022-10-30 DIAGNOSIS — R69 Illness, unspecified: Secondary | ICD-10-CM | POA: Diagnosis not present

## 2022-10-30 DIAGNOSIS — F064 Anxiety disorder due to known physiological condition: Secondary | ICD-10-CM | POA: Diagnosis not present

## 2022-10-30 DIAGNOSIS — L97822 Non-pressure chronic ulcer of other part of left lower leg with fat layer exposed: Secondary | ICD-10-CM | POA: Diagnosis not present

## 2022-10-30 DIAGNOSIS — I798 Other disorders of arteries, arterioles and capillaries in diseases classified elsewhere: Secondary | ICD-10-CM | POA: Diagnosis not present

## 2022-11-04 DIAGNOSIS — M25561 Pain in right knee: Secondary | ICD-10-CM | POA: Diagnosis not present

## 2022-11-04 DIAGNOSIS — M25562 Pain in left knee: Secondary | ICD-10-CM | POA: Diagnosis not present

## 2022-11-04 DIAGNOSIS — M170 Bilateral primary osteoarthritis of knee: Secondary | ICD-10-CM | POA: Diagnosis not present

## 2022-11-13 DIAGNOSIS — L97822 Non-pressure chronic ulcer of other part of left lower leg with fat layer exposed: Secondary | ICD-10-CM | POA: Diagnosis not present

## 2022-11-13 DIAGNOSIS — L97812 Non-pressure chronic ulcer of other part of right lower leg with fat layer exposed: Secondary | ICD-10-CM | POA: Diagnosis not present

## 2022-11-27 DIAGNOSIS — L97812 Non-pressure chronic ulcer of other part of right lower leg with fat layer exposed: Secondary | ICD-10-CM | POA: Diagnosis not present

## 2022-11-27 DIAGNOSIS — L97822 Non-pressure chronic ulcer of other part of left lower leg with fat layer exposed: Secondary | ICD-10-CM | POA: Diagnosis not present

## 2022-12-01 DIAGNOSIS — E785 Hyperlipidemia, unspecified: Secondary | ICD-10-CM | POA: Diagnosis not present

## 2022-12-01 DIAGNOSIS — Z6832 Body mass index (BMI) 32.0-32.9, adult: Secondary | ICD-10-CM | POA: Diagnosis not present

## 2022-12-18 DIAGNOSIS — L97812 Non-pressure chronic ulcer of other part of right lower leg with fat layer exposed: Secondary | ICD-10-CM | POA: Diagnosis not present

## 2023-01-01 DIAGNOSIS — L97822 Non-pressure chronic ulcer of other part of left lower leg with fat layer exposed: Secondary | ICD-10-CM | POA: Diagnosis not present

## 2023-01-01 DIAGNOSIS — L97812 Non-pressure chronic ulcer of other part of right lower leg with fat layer exposed: Secondary | ICD-10-CM | POA: Diagnosis not present

## 2023-01-08 DIAGNOSIS — I87331 Chronic venous hypertension (idiopathic) with ulcer and inflammation of right lower extremity: Secondary | ICD-10-CM | POA: Diagnosis not present

## 2023-01-08 DIAGNOSIS — I87333 Chronic venous hypertension (idiopathic) with ulcer and inflammation of bilateral lower extremity: Secondary | ICD-10-CM | POA: Diagnosis not present

## 2023-01-08 DIAGNOSIS — R600 Localized edema: Secondary | ICD-10-CM | POA: Diagnosis not present

## 2023-01-08 DIAGNOSIS — L97822 Non-pressure chronic ulcer of other part of left lower leg with fat layer exposed: Secondary | ICD-10-CM | POA: Diagnosis not present

## 2023-01-08 DIAGNOSIS — I87332 Chronic venous hypertension (idiopathic) with ulcer and inflammation of left lower extremity: Secondary | ICD-10-CM | POA: Diagnosis not present

## 2023-01-08 DIAGNOSIS — L97812 Non-pressure chronic ulcer of other part of right lower leg with fat layer exposed: Secondary | ICD-10-CM | POA: Diagnosis not present

## 2023-01-12 DIAGNOSIS — L97812 Non-pressure chronic ulcer of other part of right lower leg with fat layer exposed: Secondary | ICD-10-CM | POA: Diagnosis not present

## 2023-01-12 DIAGNOSIS — I87332 Chronic venous hypertension (idiopathic) with ulcer and inflammation of left lower extremity: Secondary | ICD-10-CM | POA: Diagnosis not present

## 2023-01-12 DIAGNOSIS — R600 Localized edema: Secondary | ICD-10-CM | POA: Diagnosis not present

## 2023-01-12 DIAGNOSIS — I87331 Chronic venous hypertension (idiopathic) with ulcer and inflammation of right lower extremity: Secondary | ICD-10-CM | POA: Diagnosis not present

## 2023-01-12 DIAGNOSIS — L97822 Non-pressure chronic ulcer of other part of left lower leg with fat layer exposed: Secondary | ICD-10-CM | POA: Diagnosis not present

## 2023-01-12 DIAGNOSIS — I87333 Chronic venous hypertension (idiopathic) with ulcer and inflammation of bilateral lower extremity: Secondary | ICD-10-CM | POA: Diagnosis not present

## 2023-01-14 ENCOUNTER — Encounter: Payer: Self-pay | Admitting: Radiology

## 2023-01-15 DIAGNOSIS — L97812 Non-pressure chronic ulcer of other part of right lower leg with fat layer exposed: Secondary | ICD-10-CM | POA: Diagnosis not present

## 2023-01-15 DIAGNOSIS — I87331 Chronic venous hypertension (idiopathic) with ulcer and inflammation of right lower extremity: Secondary | ICD-10-CM | POA: Diagnosis not present

## 2023-01-15 DIAGNOSIS — I87332 Chronic venous hypertension (idiopathic) with ulcer and inflammation of left lower extremity: Secondary | ICD-10-CM | POA: Diagnosis not present

## 2023-01-15 DIAGNOSIS — L97822 Non-pressure chronic ulcer of other part of left lower leg with fat layer exposed: Secondary | ICD-10-CM | POA: Diagnosis not present

## 2023-01-15 DIAGNOSIS — R600 Localized edema: Secondary | ICD-10-CM | POA: Diagnosis not present

## 2023-01-22 DIAGNOSIS — I87331 Chronic venous hypertension (idiopathic) with ulcer and inflammation of right lower extremity: Secondary | ICD-10-CM | POA: Diagnosis not present

## 2023-01-22 DIAGNOSIS — I87332 Chronic venous hypertension (idiopathic) with ulcer and inflammation of left lower extremity: Secondary | ICD-10-CM | POA: Diagnosis not present

## 2023-01-22 DIAGNOSIS — R600 Localized edema: Secondary | ICD-10-CM | POA: Diagnosis not present

## 2023-01-22 DIAGNOSIS — L97812 Non-pressure chronic ulcer of other part of right lower leg with fat layer exposed: Secondary | ICD-10-CM | POA: Diagnosis not present

## 2023-01-22 DIAGNOSIS — L97822 Non-pressure chronic ulcer of other part of left lower leg with fat layer exposed: Secondary | ICD-10-CM | POA: Diagnosis not present

## 2023-01-29 DIAGNOSIS — L97812 Non-pressure chronic ulcer of other part of right lower leg with fat layer exposed: Secondary | ICD-10-CM | POA: Diagnosis not present

## 2023-01-29 DIAGNOSIS — L97822 Non-pressure chronic ulcer of other part of left lower leg with fat layer exposed: Secondary | ICD-10-CM | POA: Diagnosis not present

## 2023-01-29 DIAGNOSIS — R600 Localized edema: Secondary | ICD-10-CM | POA: Diagnosis not present

## 2023-01-29 DIAGNOSIS — I87331 Chronic venous hypertension (idiopathic) with ulcer and inflammation of right lower extremity: Secondary | ICD-10-CM | POA: Diagnosis not present

## 2023-01-29 DIAGNOSIS — I87332 Chronic venous hypertension (idiopathic) with ulcer and inflammation of left lower extremity: Secondary | ICD-10-CM | POA: Diagnosis not present

## 2023-02-02 DIAGNOSIS — L97812 Non-pressure chronic ulcer of other part of right lower leg with fat layer exposed: Secondary | ICD-10-CM | POA: Diagnosis not present

## 2023-02-02 DIAGNOSIS — L97822 Non-pressure chronic ulcer of other part of left lower leg with fat layer exposed: Secondary | ICD-10-CM | POA: Diagnosis not present

## 2023-02-05 DIAGNOSIS — L97822 Non-pressure chronic ulcer of other part of left lower leg with fat layer exposed: Secondary | ICD-10-CM | POA: Diagnosis not present

## 2023-02-05 DIAGNOSIS — I87332 Chronic venous hypertension (idiopathic) with ulcer and inflammation of left lower extremity: Secondary | ICD-10-CM | POA: Diagnosis not present

## 2023-02-08 DIAGNOSIS — L97812 Non-pressure chronic ulcer of other part of right lower leg with fat layer exposed: Secondary | ICD-10-CM | POA: Diagnosis not present

## 2023-02-08 DIAGNOSIS — I872 Venous insufficiency (chronic) (peripheral): Secondary | ICD-10-CM | POA: Diagnosis not present

## 2023-02-08 DIAGNOSIS — I87313 Chronic venous hypertension (idiopathic) with ulcer of bilateral lower extremity: Secondary | ICD-10-CM | POA: Diagnosis not present

## 2023-02-08 DIAGNOSIS — S81809D Unspecified open wound, unspecified lower leg, subsequent encounter: Secondary | ICD-10-CM | POA: Diagnosis not present

## 2023-02-12 DIAGNOSIS — I87331 Chronic venous hypertension (idiopathic) with ulcer and inflammation of right lower extremity: Secondary | ICD-10-CM | POA: Diagnosis not present

## 2023-02-12 DIAGNOSIS — R600 Localized edema: Secondary | ICD-10-CM | POA: Diagnosis not present

## 2023-02-12 DIAGNOSIS — L97822 Non-pressure chronic ulcer of other part of left lower leg with fat layer exposed: Secondary | ICD-10-CM | POA: Diagnosis not present

## 2023-02-12 DIAGNOSIS — I87332 Chronic venous hypertension (idiopathic) with ulcer and inflammation of left lower extremity: Secondary | ICD-10-CM | POA: Diagnosis not present

## 2023-02-12 DIAGNOSIS — L97812 Non-pressure chronic ulcer of other part of right lower leg with fat layer exposed: Secondary | ICD-10-CM | POA: Diagnosis not present

## 2023-02-17 DIAGNOSIS — Z6832 Body mass index (BMI) 32.0-32.9, adult: Secondary | ICD-10-CM | POA: Diagnosis not present

## 2023-02-17 DIAGNOSIS — L97812 Non-pressure chronic ulcer of other part of right lower leg with fat layer exposed: Secondary | ICD-10-CM | POA: Diagnosis not present

## 2023-02-19 DIAGNOSIS — L97822 Non-pressure chronic ulcer of other part of left lower leg with fat layer exposed: Secondary | ICD-10-CM | POA: Diagnosis not present

## 2023-02-19 DIAGNOSIS — I87331 Chronic venous hypertension (idiopathic) with ulcer and inflammation of right lower extremity: Secondary | ICD-10-CM | POA: Diagnosis not present

## 2023-02-19 DIAGNOSIS — R600 Localized edema: Secondary | ICD-10-CM | POA: Diagnosis not present

## 2023-02-19 DIAGNOSIS — L97812 Non-pressure chronic ulcer of other part of right lower leg with fat layer exposed: Secondary | ICD-10-CM | POA: Diagnosis not present

## 2023-02-19 DIAGNOSIS — I87332 Chronic venous hypertension (idiopathic) with ulcer and inflammation of left lower extremity: Secondary | ICD-10-CM | POA: Diagnosis not present

## 2023-02-26 DIAGNOSIS — I87332 Chronic venous hypertension (idiopathic) with ulcer and inflammation of left lower extremity: Secondary | ICD-10-CM | POA: Diagnosis not present

## 2023-02-26 DIAGNOSIS — L97822 Non-pressure chronic ulcer of other part of left lower leg with fat layer exposed: Secondary | ICD-10-CM | POA: Diagnosis not present

## 2023-02-26 DIAGNOSIS — R600 Localized edema: Secondary | ICD-10-CM | POA: Diagnosis not present

## 2023-03-05 DIAGNOSIS — I87332 Chronic venous hypertension (idiopathic) with ulcer and inflammation of left lower extremity: Secondary | ICD-10-CM | POA: Diagnosis not present

## 2023-03-05 DIAGNOSIS — L97822 Non-pressure chronic ulcer of other part of left lower leg with fat layer exposed: Secondary | ICD-10-CM | POA: Diagnosis not present

## 2023-03-09 DIAGNOSIS — R030 Elevated blood-pressure reading, without diagnosis of hypertension: Secondary | ICD-10-CM | POA: Diagnosis not present

## 2023-03-09 DIAGNOSIS — E871 Hypo-osmolality and hyponatremia: Secondary | ICD-10-CM | POA: Diagnosis not present

## 2023-03-09 DIAGNOSIS — N529 Male erectile dysfunction, unspecified: Secondary | ICD-10-CM | POA: Diagnosis not present

## 2023-03-09 DIAGNOSIS — R42 Dizziness and giddiness: Secondary | ICD-10-CM | POA: Diagnosis not present

## 2023-03-09 DIAGNOSIS — I159 Secondary hypertension, unspecified: Secondary | ICD-10-CM | POA: Diagnosis not present

## 2023-03-09 DIAGNOSIS — R110 Nausea: Secondary | ICD-10-CM | POA: Diagnosis not present

## 2023-03-09 DIAGNOSIS — Z0000 Encounter for general adult medical examination without abnormal findings: Secondary | ICD-10-CM | POA: Diagnosis not present

## 2023-03-09 DIAGNOSIS — R7309 Other abnormal glucose: Secondary | ICD-10-CM | POA: Diagnosis not present

## 2023-03-09 DIAGNOSIS — Z1211 Encounter for screening for malignant neoplasm of colon: Secondary | ICD-10-CM | POA: Diagnosis not present

## 2023-03-09 DIAGNOSIS — Z1329 Encounter for screening for other suspected endocrine disorder: Secondary | ICD-10-CM | POA: Diagnosis not present

## 2023-03-09 DIAGNOSIS — R5383 Other fatigue: Secondary | ICD-10-CM | POA: Diagnosis not present

## 2023-03-10 DIAGNOSIS — E871 Hypo-osmolality and hyponatremia: Secondary | ICD-10-CM | POA: Diagnosis not present

## 2023-03-10 NOTE — Discharge Summary (Signed)
 Discharge Summary [facility]  Patient Name: Austin Hopkins Age: 62 y.o. Sex: male MRN: [id number] Date: 03/10/2023 Admission Date: 03/09/2023 Discharge Date: Mar 10, 2023  Attending Physician: Evalene Eastern, DO  Service: Hospital Medicine  Chief Complaint Hyponatremia   Admitting Diagnosis  Hyponatremia  Procedures During Hospitalization None   Consultations During Hospitalizations  None  History of Present Illness 62 year old gentleman with a history of GERD, prostate cancer, anxiety, arthritis and alcohol  use disorder who presented to the emergency department after directed due to an abnormal laboratory. He had blood work done in the morning PTA that showed a critically low sodium of 122.   Past Medical History Past Medical History:  Diagnosis Date  . Anxiety   . Arthritis   . Cancer    December 2015 radical prostatectomy  . GERD (gastroesophageal reflux disease)    Occasional    Surgical History Past Surgical History:  Procedure Laterality Date  . BICEPS TENDON REPAIR    . CARPAL TUNNEL RELEASE  2016  . EXTERNAL EAR SURGERY  2016   X3  . FRACTURE SURGERY    . HIP SURGERY  2021  . JOINT REPLACEMENT     Right anterior hip arthroplasty December 2020  . PERCUTANEOUS PINNING WRIST FRACTURE  2016  . PROSTATE SURGERY      Admission Medications Medications Prior to Admission  Medication Sig Dispense Refill Last Dose  . ALPRAZolam  (Xanax ) 0.5 mg tablet TAKE 1/2 TO 1 TABLET BY MOUTH TWICE DAILY AS NEEDED ORAL TWICE A DAY 30 DAYS     . aspirin  81 mg chewable tablet aspirin  81 mg chewable tablet  CHEW 1 TABLET (81 MG TOTAL) BY MOUTH 2 (TWO) TIMES DAILY.     . meclizine (Antivert) 25 mg tablet Take 1 tablet by mouth every 6 hours as needed. 30 tablet 0   . ondansetron  (Zofran ) 4 mg tablet Take 1 tablet by mouth every 8 hours as needed for nausea. 30 tablet 1   . sildenafil (Revatio) 20 mg tablet Take 1 tablet by mouth as needed. 30 tablet 1      Allergies Review of patient's allergies indicates: No Known Allergies  Family History Family History  Problem Relation Age of Onset  . Diabetes Mother      Social History Social History   Socioeconomic History  . Marital status: Single    Spouse name: Not on file  . Number of children: Not on file  . Years of education: Not on file  . Highest education level: Not on file  Occupational History  . Not on file  Tobacco Use  . Smoking status: Former    Current packs/day: 0.00    Types: Cigarettes    Quit date: 11/09/1997    Years since quitting: 25.3  . Smokeless tobacco: Never  Vaping Use  . Vaping status: Never Used  Substance and Sexual Activity  . Alcohol  use: Yes    Alcohol /week: 10.0 standard drinks of alcohol     Types: 10 Standard drinks or equivalent per week  . Drug use: Never  . Sexual activity: Not Currently    Partners: Female    Birth control/protection: None  Other Topics Concern  . Not on file  Social History Narrative  . Not on file   Social Determinants of Health   Financial Resource Strain: Low Risk  (03/03/2023)   Overall Financial Resource Strain (CARDIA)   . Difficulty of Paying Living Expenses: Not hard at all  Food Insecurity: No Food Insecurity (03/09/2023)  Hunger Vital Sign   . Worried About Programme researcher, broadcasting/film/video in the Last Year: Never true   . Ran Out of Food in the Last Year: Never true  Transportation Needs: No Transportation Needs (03/09/2023)   PRAPARE - Transportation   . Lack of Transportation (Medical): No   . Lack of Transportation (Non-Medical): No  Physical Activity: Insufficiently Active (03/03/2023)   Exercise Vital Sign   . Days of Exercise per Week: 2 days   . Minutes of Exercise per Session: 20 min  Stress: No Stress Concern Present (03/03/2023)   [employer] of Occupational Health - Occupational Stress Questionnaire   . Feeling of Stress : Only a little  Social Connections: Unknown (03/03/2023)   Social  Connection and Isolation Panel [NHANES]   . Frequency of Communication with Friends and Family: Twice a week   . Frequency of Social Gatherings with Friends and Family: Patient declined   . Attends Religious Services: Patient declined   . Active Member of Clubs or Organizations: No   . Attends Banker Meetings: Patient declined   . Marital Status: Divorced  Catering manager Violence: Not At Risk (03/09/2023)   Humiliation, Afraid, Rape, and Kick questionnaire   . Fear of Current or Ex-Partner: No   . Emotionally Abused: No   . Physically Abused: No   . Sexually Abused: No  Housing Stability: Low Risk  (03/09/2023)   Housing Stability Vital Sign   . Unable to Pay for Housing in the Last Year: No   . Number of Places Lived in the Last Year: 1   . Unstable Housing in the Last Year: No    Hospital Course Upon arrival to the ER, the patient was hemodynamically stable and afebrile.  He was noted to have a low sodium of 122 with urine studies that were suggestive of a possible hypotonic hypovolemic hyponatremia.  However, the patient had recently been on HCTZ as well along with having chronic alcohol  abuse, which may have both been contributing.  Patient was otherwise without any seizures or significant mental status changes, therefore there is no indication for hypertonic saline.  The patient was started on a free water  restriction as well.  The patient's sodium had quickly improved to 130 on day of discharge.  We had offered the patient 1 more BMP check later in the afternoon on day of discharge, the patient did not wish to stay for this.  He was counseled heavily on the risk of this including worsening hyponatremia versus fast overcorrection, the patient still had wished to go home.  He appeared to have full medical decision-making capacity at this time and was AAOx4.  We will asked that he get a repeat BMP done as an outpatient upon discharge within the next 4 days, which we will give  him an order for.  We did counsel him against the further use of alcohol .  His TSH was otherwise within normal limits.  He remained independent in his room with no skilled PT/OT needs.  We also recommended for him to get a sleep study done as an outpatient for possible underlying sleep apnea.  We will also continue him on losartan for his hypertension, as he has previously been on HCTZ that was stopped for his hyponatremia.  He was otherwise deemed stable for discharge on 5/1 and we discharged home with follow-up with his PCP.  Labs: Recent Labs  Lab 03/09/23 1202 03/09/23 2031 03/10/23 0533  NA 122*  --  130*  K 3.1*  --  3.2*  CL 80*  --  91*  GLUCOSE 123*  --  147*  BUN 8*  --  7*  CREATININE 0.7  --  0.7  CALCIUM 9.2  --  8.5  PROT 7.1  --  6.1*  BILITOT 1.5*  --  0.9  ALKPHOS 72  --  57  ALT 72*  --  55*  AST 62*  --  42  MG  --  2.2 2.4*   Recent Labs  Lab 03/09/23 1202 03/10/23 0533  WBC 10.5 6.9  HGB 14.9 13.1*  HCT 40.3* 36.2*  PLT 243 212    Imaging: No results found.  Discharge Physical Exam BP 115/53 (BP Location: Left arm, Patient Position: Lying)   Pulse 62   Temp 36.4 C (97.6 F) (Temporal)   Resp 14   Ht 180.3 cm (5' 11)   Wt 103 kg (227 lb 1.6 oz)   SpO2 96%   BMI 31.67 kg/m    Physical Exam  Constitutional: Awake and alert and in no distress HEENT: NCAT, PERRLA, EOMI Neck: Supple with no masses or tenderness Lymphatic: No cervical, axillary or inguinal adenopathy Cardiac: S1-S2 with no murmurs rubs gallops or heaves and regular rhythm.  Peripheral pulses well felt. Respiratory: Clear to auscultation bilaterally with no rhonchi or rubs.  No rales.  Normal air entry. Gastrointestinal: Soft, nontender with no organomegaly and normal bowel sounds. Skin: He has multiple excoriations on his lower limbs and has a dressing over a skin ulcer that has been healing on the left calf Musc/Extrem: Well-perfused with no clubbing, cyanosis or edema otherwise  as above Psychiatric: Normal mood and affect. Neurological: Cranial nerves grossly intact and nonfocal 2 through 12, power and tone are normal.  Discharge Diagnoses Discharge Diagnoses: Active Hospital Problems   Diagnosis  . *Hyponatremia  . Alcohol  use disorder  . Elevated BP without diagnosis of hypertension  . Body mass index (BMI) 32.0-32.9, adult    Resolved Hospital Problems   Diagnosis  . Hypokalemia    Principal Problem: Hyponatremia  Discharge Medications Current Discharge Medication List     START taking these medications   Details  losartan (Cozaar) 50 mg tablet Take 1 tablet by mouth daily. Qty: 30 tablet, Refills: 2   Associated Diagnoses: Elevated BP without diagnosis of hypertension       CONTINUE these medications which have NOT CHANGED   Details  ALPRAZolam  (Xanax ) 0.5 mg tablet TAKE 1/2 TO 1 TABLET BY MOUTH TWICE DAILY AS NEEDED ORAL TWICE A DAY 30 DAYS    aspirin  81 mg chewable tablet aspirin  81 mg chewable tablet  CHEW 1 TABLET (81 MG TOTAL) BY MOUTH 2 (TWO) TIMES DAILY.    meclizine (Antivert) 25 mg tablet Take 1 tablet by mouth every 6 hours as needed. Qty: 30 tablet, Refills: 0   Associated Diagnoses: Vertigo    ondansetron  (Zofran ) 4 mg tablet Take 1 tablet by mouth every 8 hours as needed for nausea. Qty: 30 tablet, Refills: 1   Associated Diagnoses: Nausea    sildenafil (Revatio) 20 mg tablet Take 1 tablet by mouth as needed. Qty: 30 tablet, Refills: 1   Associated Diagnoses: Erectile disorder        Disposition Home or Self Care  Condition on Discharge Stable  Activity Activity as tolerated  Diet Regular   Upcoming Appointments Future Appointments       Provider Department Center   03/19/2023 10:00 AM (Arrive by 9:45 AM) CC  TH [facility] WOUND RM 1 [facility] / Studies Pending at Time of Discharge None  Code Status at Time of Discharge Full  Code  Discharge Disposition:    No follow-ups on file.  Primary Care Physician: COREAN CHRISTELLA COPPING, NP   Total time spent on this encounter was 38 minutes and was inclusive of chart review, laboratory/imaging review, discussion of plan of care with nursing staff/case management, and face-to-face interaction with patient/family.  The chart was completed utilizing Dragon Engineer, civil (consulting). Grammatical errors, random word insertions, pronoun errors and incomplete sentences are an occasional consequence of this systems due to software limitations, ambient noise and hardware issues. Any formal questions or concerns about the content, text or information contained within the body of this dictation should be directly addressed with provider for clarification.   Evalene Eastern, [facility] Medicine Available via Diagnotes

## 2023-03-19 DIAGNOSIS — L97822 Non-pressure chronic ulcer of other part of left lower leg with fat layer exposed: Secondary | ICD-10-CM | POA: Diagnosis not present

## 2023-03-19 DIAGNOSIS — I87332 Chronic venous hypertension (idiopathic) with ulcer and inflammation of left lower extremity: Secondary | ICD-10-CM | POA: Diagnosis not present

## 2023-03-26 DIAGNOSIS — I87332 Chronic venous hypertension (idiopathic) with ulcer and inflammation of left lower extremity: Secondary | ICD-10-CM | POA: Diagnosis not present

## 2023-03-26 DIAGNOSIS — L97822 Non-pressure chronic ulcer of other part of left lower leg with fat layer exposed: Secondary | ICD-10-CM | POA: Diagnosis not present

## 2023-04-02 DIAGNOSIS — I87332 Chronic venous hypertension (idiopathic) with ulcer and inflammation of left lower extremity: Secondary | ICD-10-CM | POA: Diagnosis not present

## 2023-04-02 DIAGNOSIS — L97829 Non-pressure chronic ulcer of other part of left lower leg with unspecified severity: Secondary | ICD-10-CM | POA: Diagnosis not present

## 2023-04-02 DIAGNOSIS — L97822 Non-pressure chronic ulcer of other part of left lower leg with fat layer exposed: Secondary | ICD-10-CM | POA: Diagnosis not present

## 2023-04-08 DIAGNOSIS — Z6832 Body mass index (BMI) 32.0-32.9, adult: Secondary | ICD-10-CM | POA: Diagnosis not present

## 2023-04-08 DIAGNOSIS — L97812 Non-pressure chronic ulcer of other part of right lower leg with fat layer exposed: Secondary | ICD-10-CM | POA: Diagnosis not present

## 2023-04-09 DIAGNOSIS — I87332 Chronic venous hypertension (idiopathic) with ulcer and inflammation of left lower extremity: Secondary | ICD-10-CM | POA: Diagnosis not present

## 2023-04-09 DIAGNOSIS — L97822 Non-pressure chronic ulcer of other part of left lower leg with fat layer exposed: Secondary | ICD-10-CM | POA: Diagnosis not present

## 2023-04-23 DIAGNOSIS — I87332 Chronic venous hypertension (idiopathic) with ulcer and inflammation of left lower extremity: Secondary | ICD-10-CM | POA: Diagnosis not present

## 2023-04-23 DIAGNOSIS — L97822 Non-pressure chronic ulcer of other part of left lower leg with fat layer exposed: Secondary | ICD-10-CM | POA: Diagnosis not present

## 2023-04-30 DIAGNOSIS — L97812 Non-pressure chronic ulcer of other part of right lower leg with fat layer exposed: Secondary | ICD-10-CM | POA: Diagnosis not present

## 2023-04-30 DIAGNOSIS — I87332 Chronic venous hypertension (idiopathic) with ulcer and inflammation of left lower extremity: Secondary | ICD-10-CM | POA: Diagnosis not present

## 2023-04-30 DIAGNOSIS — S81809D Unspecified open wound, unspecified lower leg, subsequent encounter: Secondary | ICD-10-CM | POA: Diagnosis not present

## 2023-04-30 DIAGNOSIS — I87313 Chronic venous hypertension (idiopathic) with ulcer of bilateral lower extremity: Secondary | ICD-10-CM | POA: Diagnosis not present

## 2023-04-30 DIAGNOSIS — I872 Venous insufficiency (chronic) (peripheral): Secondary | ICD-10-CM | POA: Diagnosis not present

## 2023-04-30 DIAGNOSIS — L97822 Non-pressure chronic ulcer of other part of left lower leg with fat layer exposed: Secondary | ICD-10-CM | POA: Diagnosis not present

## 2023-05-07 DIAGNOSIS — I87332 Chronic venous hypertension (idiopathic) with ulcer and inflammation of left lower extremity: Secondary | ICD-10-CM | POA: Diagnosis not present

## 2023-05-07 DIAGNOSIS — L97822 Non-pressure chronic ulcer of other part of left lower leg with fat layer exposed: Secondary | ICD-10-CM | POA: Diagnosis not present

## 2023-05-12 DIAGNOSIS — L97812 Non-pressure chronic ulcer of other part of right lower leg with fat layer exposed: Secondary | ICD-10-CM | POA: Diagnosis not present

## 2023-05-12 DIAGNOSIS — Z6832 Body mass index (BMI) 32.0-32.9, adult: Secondary | ICD-10-CM | POA: Diagnosis not present

## 2023-05-28 DIAGNOSIS — B9689 Other specified bacterial agents as the cause of diseases classified elsewhere: Secondary | ICD-10-CM | POA: Diagnosis not present

## 2023-05-28 DIAGNOSIS — L97822 Non-pressure chronic ulcer of other part of left lower leg with fat layer exposed: Secondary | ICD-10-CM | POA: Diagnosis not present

## 2023-05-28 DIAGNOSIS — L97829 Non-pressure chronic ulcer of other part of left lower leg with unspecified severity: Secondary | ICD-10-CM | POA: Diagnosis not present

## 2023-05-28 DIAGNOSIS — I87332 Chronic venous hypertension (idiopathic) with ulcer and inflammation of left lower extremity: Secondary | ICD-10-CM | POA: Diagnosis not present

## 2023-05-31 DIAGNOSIS — I87313 Chronic venous hypertension (idiopathic) with ulcer of bilateral lower extremity: Secondary | ICD-10-CM | POA: Diagnosis not present

## 2023-05-31 DIAGNOSIS — L97822 Non-pressure chronic ulcer of other part of left lower leg with fat layer exposed: Secondary | ICD-10-CM | POA: Diagnosis not present

## 2023-05-31 DIAGNOSIS — I872 Venous insufficiency (chronic) (peripheral): Secondary | ICD-10-CM | POA: Diagnosis not present

## 2023-05-31 DIAGNOSIS — L97812 Non-pressure chronic ulcer of other part of right lower leg with fat layer exposed: Secondary | ICD-10-CM | POA: Diagnosis not present

## 2023-06-04 DIAGNOSIS — L97822 Non-pressure chronic ulcer of other part of left lower leg with fat layer exposed: Secondary | ICD-10-CM | POA: Diagnosis not present

## 2023-06-04 DIAGNOSIS — B9689 Other specified bacterial agents as the cause of diseases classified elsewhere: Secondary | ICD-10-CM | POA: Diagnosis not present

## 2023-06-04 DIAGNOSIS — I87332 Chronic venous hypertension (idiopathic) with ulcer and inflammation of left lower extremity: Secondary | ICD-10-CM | POA: Diagnosis not present

## 2023-06-11 DIAGNOSIS — Z6832 Body mass index (BMI) 32.0-32.9, adult: Secondary | ICD-10-CM | POA: Diagnosis not present

## 2023-06-11 DIAGNOSIS — L97822 Non-pressure chronic ulcer of other part of left lower leg with fat layer exposed: Secondary | ICD-10-CM | POA: Diagnosis not present

## 2023-06-11 DIAGNOSIS — I87332 Chronic venous hypertension (idiopathic) with ulcer and inflammation of left lower extremity: Secondary | ICD-10-CM | POA: Diagnosis not present

## 2023-06-11 DIAGNOSIS — B9689 Other specified bacterial agents as the cause of diseases classified elsewhere: Secondary | ICD-10-CM | POA: Diagnosis not present

## 2023-06-11 DIAGNOSIS — L97812 Non-pressure chronic ulcer of other part of right lower leg with fat layer exposed: Secondary | ICD-10-CM | POA: Diagnosis not present

## 2023-06-17 DIAGNOSIS — R4582 Worries: Secondary | ICD-10-CM | POA: Diagnosis not present

## 2023-06-18 DIAGNOSIS — I87332 Chronic venous hypertension (idiopathic) with ulcer and inflammation of left lower extremity: Secondary | ICD-10-CM | POA: Diagnosis not present

## 2023-06-18 DIAGNOSIS — L97822 Non-pressure chronic ulcer of other part of left lower leg with fat layer exposed: Secondary | ICD-10-CM | POA: Diagnosis not present

## 2023-06-25 DIAGNOSIS — I872 Venous insufficiency (chronic) (peripheral): Secondary | ICD-10-CM | POA: Diagnosis not present

## 2023-06-25 DIAGNOSIS — L97822 Non-pressure chronic ulcer of other part of left lower leg with fat layer exposed: Secondary | ICD-10-CM | POA: Diagnosis not present

## 2023-06-25 DIAGNOSIS — I87313 Chronic venous hypertension (idiopathic) with ulcer of bilateral lower extremity: Secondary | ICD-10-CM | POA: Diagnosis not present

## 2023-06-25 DIAGNOSIS — S81809D Unspecified open wound, unspecified lower leg, subsequent encounter: Secondary | ICD-10-CM | POA: Diagnosis not present

## 2023-06-25 DIAGNOSIS — I87332 Chronic venous hypertension (idiopathic) with ulcer and inflammation of left lower extremity: Secondary | ICD-10-CM | POA: Diagnosis not present

## 2023-06-25 DIAGNOSIS — L97812 Non-pressure chronic ulcer of other part of right lower leg with fat layer exposed: Secondary | ICD-10-CM | POA: Diagnosis not present

## 2023-07-02 DIAGNOSIS — I87332 Chronic venous hypertension (idiopathic) with ulcer and inflammation of left lower extremity: Secondary | ICD-10-CM | POA: Diagnosis not present

## 2023-07-02 DIAGNOSIS — L97822 Non-pressure chronic ulcer of other part of left lower leg with fat layer exposed: Secondary | ICD-10-CM | POA: Diagnosis not present

## 2023-07-05 DIAGNOSIS — I872 Venous insufficiency (chronic) (peripheral): Secondary | ICD-10-CM | POA: Diagnosis not present

## 2023-07-05 DIAGNOSIS — S81809D Unspecified open wound, unspecified lower leg, subsequent encounter: Secondary | ICD-10-CM | POA: Diagnosis not present

## 2023-07-05 DIAGNOSIS — L97822 Non-pressure chronic ulcer of other part of left lower leg with fat layer exposed: Secondary | ICD-10-CM | POA: Diagnosis not present

## 2023-07-05 DIAGNOSIS — L97812 Non-pressure chronic ulcer of other part of right lower leg with fat layer exposed: Secondary | ICD-10-CM | POA: Diagnosis not present

## 2023-07-05 DIAGNOSIS — I87313 Chronic venous hypertension (idiopathic) with ulcer of bilateral lower extremity: Secondary | ICD-10-CM | POA: Diagnosis not present

## 2023-07-09 DIAGNOSIS — L97822 Non-pressure chronic ulcer of other part of left lower leg with fat layer exposed: Secondary | ICD-10-CM | POA: Diagnosis not present

## 2023-07-09 DIAGNOSIS — I87332 Chronic venous hypertension (idiopathic) with ulcer and inflammation of left lower extremity: Secondary | ICD-10-CM | POA: Diagnosis not present

## 2023-07-16 DIAGNOSIS — L97822 Non-pressure chronic ulcer of other part of left lower leg with fat layer exposed: Secondary | ICD-10-CM | POA: Diagnosis not present

## 2023-07-16 DIAGNOSIS — I87332 Chronic venous hypertension (idiopathic) with ulcer and inflammation of left lower extremity: Secondary | ICD-10-CM | POA: Diagnosis not present

## 2023-07-23 DIAGNOSIS — L97822 Non-pressure chronic ulcer of other part of left lower leg with fat layer exposed: Secondary | ICD-10-CM | POA: Diagnosis not present

## 2023-07-23 DIAGNOSIS — I87332 Chronic venous hypertension (idiopathic) with ulcer and inflammation of left lower extremity: Secondary | ICD-10-CM | POA: Diagnosis not present

## 2023-08-02 DIAGNOSIS — L97812 Non-pressure chronic ulcer of other part of right lower leg with fat layer exposed: Secondary | ICD-10-CM | POA: Diagnosis not present

## 2023-08-02 DIAGNOSIS — L97822 Non-pressure chronic ulcer of other part of left lower leg with fat layer exposed: Secondary | ICD-10-CM | POA: Diagnosis not present

## 2023-08-04 DIAGNOSIS — Z1212 Encounter for screening for malignant neoplasm of rectum: Secondary | ICD-10-CM | POA: Diagnosis not present

## 2023-08-04 DIAGNOSIS — Z1211 Encounter for screening for malignant neoplasm of colon: Secondary | ICD-10-CM | POA: Diagnosis not present

## 2023-08-06 DIAGNOSIS — L97822 Non-pressure chronic ulcer of other part of left lower leg with fat layer exposed: Secondary | ICD-10-CM | POA: Diagnosis not present

## 2023-08-06 DIAGNOSIS — S81809D Unspecified open wound, unspecified lower leg, subsequent encounter: Secondary | ICD-10-CM | POA: Diagnosis not present

## 2023-08-06 DIAGNOSIS — L97812 Non-pressure chronic ulcer of other part of right lower leg with fat layer exposed: Secondary | ICD-10-CM | POA: Diagnosis not present

## 2023-08-06 DIAGNOSIS — I87313 Chronic venous hypertension (idiopathic) with ulcer of bilateral lower extremity: Secondary | ICD-10-CM | POA: Diagnosis not present

## 2023-08-06 DIAGNOSIS — I872 Venous insufficiency (chronic) (peripheral): Secondary | ICD-10-CM | POA: Diagnosis not present

## 2023-08-06 DIAGNOSIS — I87332 Chronic venous hypertension (idiopathic) with ulcer and inflammation of left lower extremity: Secondary | ICD-10-CM | POA: Diagnosis not present

## 2023-08-08 LAB — COLOGUARD: COLOGUARD: NEGATIVE

## 2023-08-20 DIAGNOSIS — L97829 Non-pressure chronic ulcer of other part of left lower leg with unspecified severity: Secondary | ICD-10-CM | POA: Diagnosis not present

## 2023-08-27 DIAGNOSIS — L97822 Non-pressure chronic ulcer of other part of left lower leg with fat layer exposed: Secondary | ICD-10-CM | POA: Diagnosis not present

## 2023-08-27 DIAGNOSIS — I87332 Chronic venous hypertension (idiopathic) with ulcer and inflammation of left lower extremity: Secondary | ICD-10-CM | POA: Diagnosis not present

## 2023-09-02 DIAGNOSIS — I872 Venous insufficiency (chronic) (peripheral): Secondary | ICD-10-CM | POA: Diagnosis not present

## 2023-09-02 DIAGNOSIS — I87313 Chronic venous hypertension (idiopathic) with ulcer of bilateral lower extremity: Secondary | ICD-10-CM | POA: Diagnosis not present

## 2023-09-02 DIAGNOSIS — L97822 Non-pressure chronic ulcer of other part of left lower leg with fat layer exposed: Secondary | ICD-10-CM | POA: Diagnosis not present

## 2023-09-02 DIAGNOSIS — L97812 Non-pressure chronic ulcer of other part of right lower leg with fat layer exposed: Secondary | ICD-10-CM | POA: Diagnosis not present

## 2023-09-03 DIAGNOSIS — L97822 Non-pressure chronic ulcer of other part of left lower leg with fat layer exposed: Secondary | ICD-10-CM | POA: Diagnosis not present

## 2023-09-03 DIAGNOSIS — I87332 Chronic venous hypertension (idiopathic) with ulcer and inflammation of left lower extremity: Secondary | ICD-10-CM | POA: Diagnosis not present

## 2023-09-06 DIAGNOSIS — E785 Hyperlipidemia, unspecified: Secondary | ICD-10-CM | POA: Diagnosis not present

## 2023-09-06 DIAGNOSIS — F419 Anxiety disorder, unspecified: Secondary | ICD-10-CM | POA: Diagnosis not present

## 2023-09-10 DIAGNOSIS — L97822 Non-pressure chronic ulcer of other part of left lower leg with fat layer exposed: Secondary | ICD-10-CM | POA: Diagnosis not present

## 2023-09-10 DIAGNOSIS — I87332 Chronic venous hypertension (idiopathic) with ulcer and inflammation of left lower extremity: Secondary | ICD-10-CM | POA: Diagnosis not present

## 2023-09-17 DIAGNOSIS — B965 Pseudomonas (aeruginosa) (mallei) (pseudomallei) as the cause of diseases classified elsewhere: Secondary | ICD-10-CM | POA: Diagnosis not present

## 2023-09-17 DIAGNOSIS — I87332 Chronic venous hypertension (idiopathic) with ulcer and inflammation of left lower extremity: Secondary | ICD-10-CM | POA: Diagnosis not present

## 2023-09-17 DIAGNOSIS — B9689 Other specified bacterial agents as the cause of diseases classified elsewhere: Secondary | ICD-10-CM | POA: Diagnosis not present

## 2023-09-17 DIAGNOSIS — L97822 Non-pressure chronic ulcer of other part of left lower leg with fat layer exposed: Secondary | ICD-10-CM | POA: Diagnosis not present

## 2023-09-20 DIAGNOSIS — I87313 Chronic venous hypertension (idiopathic) with ulcer of bilateral lower extremity: Secondary | ICD-10-CM | POA: Diagnosis not present

## 2023-09-20 DIAGNOSIS — L97822 Non-pressure chronic ulcer of other part of left lower leg with fat layer exposed: Secondary | ICD-10-CM | POA: Diagnosis not present

## 2023-09-20 DIAGNOSIS — L97812 Non-pressure chronic ulcer of other part of right lower leg with fat layer exposed: Secondary | ICD-10-CM | POA: Diagnosis not present

## 2023-09-20 DIAGNOSIS — S81809D Unspecified open wound, unspecified lower leg, subsequent encounter: Secondary | ICD-10-CM | POA: Diagnosis not present

## 2023-09-20 DIAGNOSIS — I872 Venous insufficiency (chronic) (peripheral): Secondary | ICD-10-CM | POA: Diagnosis not present

## 2023-09-24 DIAGNOSIS — I87332 Chronic venous hypertension (idiopathic) with ulcer and inflammation of left lower extremity: Secondary | ICD-10-CM | POA: Diagnosis not present

## 2023-09-24 DIAGNOSIS — L97822 Non-pressure chronic ulcer of other part of left lower leg with fat layer exposed: Secondary | ICD-10-CM | POA: Diagnosis not present

## 2023-09-24 DIAGNOSIS — R464 Slowness and poor responsiveness: Secondary | ICD-10-CM | POA: Diagnosis not present

## 2023-10-01 DIAGNOSIS — I87332 Chronic venous hypertension (idiopathic) with ulcer and inflammation of left lower extremity: Secondary | ICD-10-CM | POA: Diagnosis not present

## 2023-10-01 DIAGNOSIS — L97822 Non-pressure chronic ulcer of other part of left lower leg with fat layer exposed: Secondary | ICD-10-CM | POA: Diagnosis not present

## 2023-10-22 DIAGNOSIS — L97822 Non-pressure chronic ulcer of other part of left lower leg with fat layer exposed: Secondary | ICD-10-CM | POA: Diagnosis not present

## 2023-10-22 DIAGNOSIS — R600 Localized edema: Secondary | ICD-10-CM | POA: Diagnosis not present

## 2023-10-22 DIAGNOSIS — I87332 Chronic venous hypertension (idiopathic) with ulcer and inflammation of left lower extremity: Secondary | ICD-10-CM | POA: Diagnosis not present

## 2023-10-29 DIAGNOSIS — L97822 Non-pressure chronic ulcer of other part of left lower leg with fat layer exposed: Secondary | ICD-10-CM | POA: Diagnosis not present

## 2023-10-29 DIAGNOSIS — I87332 Chronic venous hypertension (idiopathic) with ulcer and inflammation of left lower extremity: Secondary | ICD-10-CM | POA: Diagnosis not present

## 2023-10-29 DIAGNOSIS — R600 Localized edema: Secondary | ICD-10-CM | POA: Diagnosis not present

## 2023-11-01 DIAGNOSIS — S81809D Unspecified open wound, unspecified lower leg, subsequent encounter: Secondary | ICD-10-CM | POA: Diagnosis not present

## 2023-11-01 DIAGNOSIS — I87313 Chronic venous hypertension (idiopathic) with ulcer of bilateral lower extremity: Secondary | ICD-10-CM | POA: Diagnosis not present

## 2023-11-01 DIAGNOSIS — L97812 Non-pressure chronic ulcer of other part of right lower leg with fat layer exposed: Secondary | ICD-10-CM | POA: Diagnosis not present

## 2023-11-01 DIAGNOSIS — L97822 Non-pressure chronic ulcer of other part of left lower leg with fat layer exposed: Secondary | ICD-10-CM | POA: Diagnosis not present

## 2023-11-01 DIAGNOSIS — I872 Venous insufficiency (chronic) (peripheral): Secondary | ICD-10-CM | POA: Diagnosis not present

## 2023-12-23 DIAGNOSIS — Z6832 Body mass index (BMI) 32.0-32.9, adult: Secondary | ICD-10-CM | POA: Diagnosis not present

## 2023-12-23 DIAGNOSIS — R110 Nausea: Secondary | ICD-10-CM | POA: Diagnosis not present

## 2023-12-23 DIAGNOSIS — G4700 Insomnia, unspecified: Secondary | ICD-10-CM | POA: Diagnosis not present

## 2023-12-23 DIAGNOSIS — E785 Hyperlipidemia, unspecified: Secondary | ICD-10-CM | POA: Diagnosis not present

## 2023-12-23 DIAGNOSIS — I10 Essential (primary) hypertension: Secondary | ICD-10-CM | POA: Diagnosis not present
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
## 2023-12-31 DIAGNOSIS — L97822 Non-pressure chronic ulcer of other part of left lower leg with fat layer exposed: Secondary | ICD-10-CM | POA: Diagnosis not present

## 2023-12-31 DIAGNOSIS — I87332 Chronic venous hypertension (idiopathic) with ulcer and inflammation of left lower extremity: Secondary | ICD-10-CM | POA: Diagnosis not present

## 2024-01-05 DIAGNOSIS — M545 Low back pain, unspecified: Secondary | ICD-10-CM | POA: Diagnosis not present

## 2024-01-21 DIAGNOSIS — L97822 Non-pressure chronic ulcer of other part of left lower leg with fat layer exposed: Secondary | ICD-10-CM | POA: Diagnosis not present

## 2024-01-21 DIAGNOSIS — I87332 Chronic venous hypertension (idiopathic) with ulcer and inflammation of left lower extremity: Secondary | ICD-10-CM | POA: Diagnosis not present

## 2024-02-04 DIAGNOSIS — S81809D Unspecified open wound, unspecified lower leg, subsequent encounter: Secondary | ICD-10-CM | POA: Diagnosis not present

## 2024-02-04 DIAGNOSIS — L97812 Non-pressure chronic ulcer of other part of right lower leg with fat layer exposed: Secondary | ICD-10-CM | POA: Diagnosis not present

## 2024-02-04 DIAGNOSIS — L97822 Non-pressure chronic ulcer of other part of left lower leg with fat layer exposed: Secondary | ICD-10-CM | POA: Diagnosis not present

## 2024-02-04 DIAGNOSIS — I87313 Chronic venous hypertension (idiopathic) with ulcer of bilateral lower extremity: Secondary | ICD-10-CM | POA: Diagnosis not present

## 2024-02-04 DIAGNOSIS — I872 Venous insufficiency (chronic) (peripheral): Secondary | ICD-10-CM | POA: Diagnosis not present

## 2024-02-04 DIAGNOSIS — I87332 Chronic venous hypertension (idiopathic) with ulcer and inflammation of left lower extremity: Secondary | ICD-10-CM | POA: Diagnosis not present

## 2024-02-17 DIAGNOSIS — F419 Anxiety disorder, unspecified: Secondary | ICD-10-CM | POA: Diagnosis not present

## 2024-03-09 DIAGNOSIS — F411 Generalized anxiety disorder: Secondary | ICD-10-CM | POA: Diagnosis not present

## 2024-03-09 DIAGNOSIS — E6609 Other obesity due to excess calories: Secondary | ICD-10-CM | POA: Diagnosis not present

## 2024-03-09 DIAGNOSIS — E7849 Other hyperlipidemia: Secondary | ICD-10-CM | POA: Diagnosis not present

## 2024-03-09 DIAGNOSIS — Z6831 Body mass index (BMI) 31.0-31.9, adult: Secondary | ICD-10-CM | POA: Diagnosis not present

## 2024-03-09 DIAGNOSIS — Z125 Encounter for screening for malignant neoplasm of prostate: Secondary | ICD-10-CM | POA: Diagnosis not present

## 2024-03-09 DIAGNOSIS — R7309 Other abnormal glucose: Secondary | ICD-10-CM | POA: Diagnosis not present

## 2024-03-09 DIAGNOSIS — Z79899 Other long term (current) drug therapy: Secondary | ICD-10-CM | POA: Diagnosis not present

## 2024-06-12 DIAGNOSIS — Z6831 Body mass index (BMI) 31.0-31.9, adult: Secondary | ICD-10-CM | POA: Diagnosis not present

## 2024-06-12 DIAGNOSIS — F411 Generalized anxiety disorder: Secondary | ICD-10-CM | POA: Diagnosis not present

## 2024-07-13 DIAGNOSIS — R7989 Other specified abnormal findings of blood chemistry: Secondary | ICD-10-CM | POA: Diagnosis not present

## 2024-07-13 DIAGNOSIS — E669 Obesity, unspecified: Secondary | ICD-10-CM | POA: Diagnosis not present

## 2024-07-13 DIAGNOSIS — G540 Brachial plexus disorders: Secondary | ICD-10-CM | POA: Diagnosis not present

## 2024-07-13 DIAGNOSIS — R739 Hyperglycemia, unspecified: Secondary | ICD-10-CM | POA: Diagnosis not present

## 2024-07-13 DIAGNOSIS — Z23 Encounter for immunization: Secondary | ICD-10-CM | POA: Diagnosis not present

## 2024-07-13 DIAGNOSIS — F419 Anxiety disorder, unspecified: Secondary | ICD-10-CM | POA: Diagnosis not present

## 2024-07-13 DIAGNOSIS — M1990 Unspecified osteoarthritis, unspecified site: Secondary | ICD-10-CM | POA: Diagnosis not present

## 2024-07-13 DIAGNOSIS — F1090 Alcohol use, unspecified, uncomplicated: Secondary | ICD-10-CM | POA: Diagnosis not present

## 2024-07-13 DIAGNOSIS — E785 Hyperlipidemia, unspecified: Secondary | ICD-10-CM | POA: Diagnosis not present

## 2024-07-13 DIAGNOSIS — H61301 Acquired stenosis of right external ear canal, unspecified: Secondary | ICD-10-CM | POA: Diagnosis not present

## 2024-07-13 DIAGNOSIS — I10 Essential (primary) hypertension: Secondary | ICD-10-CM | POA: Diagnosis not present

## 2024-07-13 DIAGNOSIS — G894 Chronic pain syndrome: Secondary | ICD-10-CM | POA: Diagnosis not present

## 2024-07-13 DIAGNOSIS — C61 Malignant neoplasm of prostate: Secondary | ICD-10-CM | POA: Diagnosis not present

## 2024-08-04 DIAGNOSIS — A46 Erysipelas: Secondary | ICD-10-CM | POA: Diagnosis not present

## 2024-08-29 DIAGNOSIS — H43811 Vitreous degeneration, right eye: Secondary | ICD-10-CM | POA: Diagnosis not present

## 2024-08-29 DIAGNOSIS — H524 Presbyopia: Secondary | ICD-10-CM | POA: Diagnosis not present

## 2024-08-29 DIAGNOSIS — H40022 Open angle with borderline findings, high risk, left eye: Secondary | ICD-10-CM | POA: Diagnosis not present

## 2024-08-29 DIAGNOSIS — H2513 Age-related nuclear cataract, bilateral: Secondary | ICD-10-CM | POA: Diagnosis not present

## 2024-08-29 DIAGNOSIS — H52223 Regular astigmatism, bilateral: Secondary | ICD-10-CM | POA: Diagnosis not present

## 2024-08-29 DIAGNOSIS — H40052 Ocular hypertension, left eye: Secondary | ICD-10-CM | POA: Diagnosis not present

## 2024-09-04 ENCOUNTER — Other Ambulatory Visit: Payer: Self-pay

## 2024-09-04 ENCOUNTER — Other Ambulatory Visit (INDEPENDENT_AMBULATORY_CARE_PROVIDER_SITE_OTHER): Payer: Self-pay

## 2024-09-04 ENCOUNTER — Encounter: Payer: Self-pay | Admitting: Orthopaedic Surgery

## 2024-09-04 ENCOUNTER — Ambulatory Visit: Admitting: Orthopaedic Surgery

## 2024-09-04 DIAGNOSIS — M79604 Pain in right leg: Secondary | ICD-10-CM

## 2024-09-04 DIAGNOSIS — M1712 Unilateral primary osteoarthritis, left knee: Secondary | ICD-10-CM

## 2024-09-04 DIAGNOSIS — M7061 Trochanteric bursitis, right hip: Secondary | ICD-10-CM

## 2024-09-04 DIAGNOSIS — G8929 Other chronic pain: Secondary | ICD-10-CM | POA: Diagnosis not present

## 2024-09-04 DIAGNOSIS — M5441 Lumbago with sciatica, right side: Secondary | ICD-10-CM | POA: Diagnosis not present

## 2024-09-04 DIAGNOSIS — M1711 Unilateral primary osteoarthritis, right knee: Secondary | ICD-10-CM | POA: Insufficient documentation

## 2024-09-04 DIAGNOSIS — M170 Bilateral primary osteoarthritis of knee: Secondary | ICD-10-CM | POA: Diagnosis not present

## 2024-09-04 MED ORDER — METHYLPREDNISOLONE ACETATE 40 MG/ML IJ SUSP
40.0000 mg | INTRAMUSCULAR | Status: AC | PRN
  Administered 2024-09-04: 40 mg via INTRA_ARTICULAR

## 2024-09-04 MED ORDER — LIDOCAINE HCL 1 % IJ SOLN
3.0000 mL | INTRAMUSCULAR | Status: AC | PRN
  Administered 2024-09-04: 3 mL

## 2024-09-04 NOTE — Progress Notes (Signed)
 Austin Hopkins is a 63 year old gentleman that we have not seen in some time now.  We actually replaced his right hip back in December 2021.  [place] but is [place] back here.  At 1 time we were going to replace his left knee.  He says both of his knees are hurting significantly and he has been having right sided sciatica and pain in that right hip that we replaced.  On exam both knees have varus malalignment.  He does have venous stasis changes in both legs but no open wounds today.  He is not a diabetic and his hemoglobin A1c is always low.  He does see Dr. Norleen Jungling is his primary care physician here in town.  Both knees have a lot of pain throughout the arc of motion of his knees.  His right hip moves smoothly and fluidly but there is significant pain in the sciatic region but mainly over the lateral hip and the IT band.  X-rays of both knees today show varus malalignment with end-stage tricompartment thrice and bone-on-bone wear of the medial compartment, patellofemoral joint and osteophytes in all 3 compartments of both knees.  An AP and lateral of the lumbar spine shows degenerative changes at multiple levels.  Per his request I did place a steroid injection in both knees today and in his right hip IT band area.  Will see him back in 6 weeks to see how he is doing overall and to consider setting him up for replacing one of his knees.  At his next visit I would like a standing AP pelvis and lateral of his right hip replacement.    Procedure Note  Patient: Austin Hopkins             Date of Birth: Mar 09, 1961           MRN: [id number]             Visit Date: 09/04/2024  Procedures: Visit Diagnoses:  1. Unilateral primary osteoarthritis, left knee   2. Pain in right leg   3. Chronic right-sided low back pain with right-sided sciatica   4. Unilateral primary osteoarthritis, right knee   5. Trochanteric bursitis, right hip     Large Joint Inj: R knee  on 09/04/2024 2:43 PM Indications: diagnostic evaluation and pain Details: 22 G 1.5 in needle, superolateral approach  Arthrogram: No  Medications: 3 mL lidocaine  1 %; 40 mg methylPREDNISolone  acetate 40 MG/ML Outcome: tolerated well, no immediate complications Procedure, treatment alternatives, risks and benefits explained, specific risks discussed. Consent was given by the patient. Immediately prior to procedure a time out was called to verify the correct patient, procedure, equipment, support staff and site/side marked as required. Patient was prepped and draped in the usual sterile fashion.    Large Joint Inj: L knee on 09/04/2024 2:43 PM Indications: diagnostic evaluation and pain Details: 22 G 1.5 in needle, superolateral approach  Arthrogram: No  Medications: 3 mL lidocaine  1 %; 40 mg methylPREDNISolone  acetate 40 MG/ML Outcome: tolerated well, no immediate complications Procedure, treatment alternatives, risks and benefits explained, specific risks discussed. Consent was given by the patient. Immediately prior to procedure a time out was called to verify the correct patient, procedure, equipment, support staff and site/side marked as required. Patient was prepped and draped in the usual sterile fashion.

## 2024-09-11 ENCOUNTER — Encounter: Payer: Self-pay | Admitting: Radiology

## 2024-10-03 DIAGNOSIS — H2513 Age-related nuclear cataract, bilateral: Secondary | ICD-10-CM | POA: Diagnosis not present

## 2024-10-03 DIAGNOSIS — H40053 Ocular hypertension, bilateral: Secondary | ICD-10-CM | POA: Diagnosis not present

## 2024-10-03 DIAGNOSIS — H40022 Open angle with borderline findings, high risk, left eye: Secondary | ICD-10-CM | POA: Diagnosis not present

## 2024-10-16 ENCOUNTER — Ambulatory Visit: Admitting: Orthopaedic Surgery

## 2024-11-15 ENCOUNTER — Ambulatory Visit: Admitting: Orthopaedic Surgery

## 2024-11-15 VITALS — Ht 67.72 in | Wt 219.4 lb

## 2024-11-15 DIAGNOSIS — Z96641 Presence of right artificial hip joint: Secondary | ICD-10-CM | POA: Diagnosis not present

## 2024-11-15 MED ORDER — CELECOXIB 200 MG PO CAPS: 200.0000 mg | ORAL_CAPSULE | Freq: Two times a day (BID) | ORAL | 1 refills | Status: AC | PRN

## 2024-11-15 NOTE — Addendum Note (Signed)
 Addended by: Gearl Kimbrough, CHRISTOHER Y on: 11/15/2024 05:21 PM   Modules accepted: Orders

## 2024-11-15 NOTE — Progress Notes (Signed)
 Austin Hopkins is well-known to us .  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  We replaced his right hip back in December 2021.  He has known arthritis in both of his knees and at one point was considering knee replacement surgery but then decided not to have that done.  He says his knees really do not hurt much but is more of his mobility as he gets around that affects his right hip with some weakness and stiffness.  On exam his right operative hip moves smoothly and fluidly but he does have weakness with hip flexion on that side.  Both knees have slight flexion contractures and slight varus malalignment I think that also affects his gait.  He is interested in physical therapy with any modalities that can help improve the strength in his right hip and both knees and help with his gait overall with any other suggestions per the therapist in terms of modalities that could help him overall.  We will work on setting him up for physical therapy upstairs here and then I would like to see him back in about 2 months and at that visit we do need to have an AP pelvis and lateral of his right hip.

## 2024-11-16 ENCOUNTER — Other Ambulatory Visit: Payer: Self-pay

## 2024-11-16 DIAGNOSIS — M79604 Pain in right leg: Secondary | ICD-10-CM

## 2024-11-16 DIAGNOSIS — M1711 Unilateral primary osteoarthritis, right knee: Secondary | ICD-10-CM

## 2024-11-16 DIAGNOSIS — M7061 Trochanteric bursitis, right hip: Secondary | ICD-10-CM

## 2024-11-16 DIAGNOSIS — Z96641 Presence of right artificial hip joint: Secondary | ICD-10-CM

## 2024-11-16 DIAGNOSIS — M1712 Unilateral primary osteoarthritis, left knee: Secondary | ICD-10-CM

## 2024-12-06 ENCOUNTER — Ambulatory Visit: Admitting: Physical Therapy

## 2024-12-19 ENCOUNTER — Ambulatory Visit: Admitting: Rehabilitative and Restorative Service Providers"
